# Patient Record
Sex: Female | Born: 1980 | Race: White | Hispanic: No | Marital: Married | Smoking: Former smoker
Health system: Southern US, Community
[De-identification: ages and names within clinical notes are randomized; demographics above are authoritative.]

## PROBLEM LIST (undated history)

## (undated) DIAGNOSIS — E119 Type 2 diabetes mellitus without complications: Secondary | ICD-10-CM

## (undated) DIAGNOSIS — E079 Disorder of thyroid, unspecified: Secondary | ICD-10-CM

## (undated) HISTORY — PX: THYROIDECTOMY: SHX17

## (undated) HISTORY — PX: COMBINED HYSTEROSCOPY DIAGNOSTIC / D&C: SUR297

## (undated) HISTORY — PX: TONSILLECTOMY: SUR1361

---

## 2003-12-13 ENCOUNTER — Other Ambulatory Visit: Admission: RE | Admit: 2003-12-13 | Discharge: 2003-12-13 | Payer: Self-pay | Admitting: Obstetrics & Gynecology

## 2004-08-17 ENCOUNTER — Ambulatory Visit (HOSPITAL_COMMUNITY): Admission: RE | Admit: 2004-08-17 | Discharge: 2004-08-17 | Payer: Self-pay | Admitting: Obstetrics & Gynecology

## 2004-09-17 ENCOUNTER — Encounter (INDEPENDENT_AMBULATORY_CARE_PROVIDER_SITE_OTHER): Payer: Self-pay | Admitting: *Deleted

## 2004-09-17 ENCOUNTER — Ambulatory Visit (HOSPITAL_COMMUNITY): Admission: RE | Admit: 2004-09-17 | Discharge: 2004-09-17 | Payer: Self-pay | Admitting: Obstetrics & Gynecology

## 2005-01-16 ENCOUNTER — Other Ambulatory Visit: Admission: RE | Admit: 2005-01-16 | Discharge: 2005-01-16 | Payer: Self-pay | Admitting: Obstetrics & Gynecology

## 2005-10-04 ENCOUNTER — Ambulatory Visit (HOSPITAL_COMMUNITY): Admission: RE | Admit: 2005-10-04 | Discharge: 2005-10-04 | Payer: Self-pay | Admitting: Urology

## 2005-12-09 ENCOUNTER — Inpatient Hospital Stay (HOSPITAL_COMMUNITY): Admission: AD | Admit: 2005-12-09 | Discharge: 2005-12-09 | Payer: Self-pay | Admitting: Obstetrics and Gynecology

## 2006-01-12 ENCOUNTER — Inpatient Hospital Stay (HOSPITAL_COMMUNITY): Admission: RE | Admit: 2006-01-12 | Discharge: 2006-01-12 | Payer: Self-pay | Admitting: Obstetrics & Gynecology

## 2006-02-15 ENCOUNTER — Inpatient Hospital Stay (HOSPITAL_COMMUNITY): Admission: AD | Admit: 2006-02-15 | Discharge: 2006-02-15 | Payer: Self-pay | Admitting: Obstetrics and Gynecology

## 2007-02-11 ENCOUNTER — Inpatient Hospital Stay (HOSPITAL_COMMUNITY): Admission: RE | Admit: 2007-02-11 | Discharge: 2007-02-14 | Payer: Self-pay | Admitting: Obstetrics & Gynecology

## 2007-02-25 ENCOUNTER — Inpatient Hospital Stay (HOSPITAL_COMMUNITY): Admission: EM | Admit: 2007-02-25 | Discharge: 2007-03-01 | Payer: Self-pay | Admitting: Emergency Medicine

## 2007-02-25 ENCOUNTER — Encounter: Payer: Self-pay | Admitting: Obstetrics & Gynecology

## 2007-02-26 ENCOUNTER — Encounter (INDEPENDENT_AMBULATORY_CARE_PROVIDER_SITE_OTHER): Payer: Self-pay | Admitting: Neurology

## 2007-02-27 ENCOUNTER — Encounter (INDEPENDENT_AMBULATORY_CARE_PROVIDER_SITE_OTHER): Payer: Self-pay | Admitting: Neurology

## 2007-03-27 ENCOUNTER — Ambulatory Visit (HOSPITAL_COMMUNITY): Admission: RE | Admit: 2007-03-27 | Discharge: 2007-03-27 | Payer: Self-pay | Admitting: Neurology

## 2007-04-30 ENCOUNTER — Encounter: Admission: RE | Admit: 2007-04-30 | Discharge: 2007-04-30 | Payer: Self-pay | Admitting: Neurology

## 2009-07-05 ENCOUNTER — Encounter: Admission: RE | Admit: 2009-07-05 | Discharge: 2009-07-05 | Payer: Self-pay | Admitting: Endocrinology

## 2009-07-18 ENCOUNTER — Other Ambulatory Visit: Admission: RE | Admit: 2009-07-18 | Discharge: 2009-07-18 | Payer: Self-pay | Admitting: Interventional Radiology

## 2009-07-18 ENCOUNTER — Encounter: Admission: RE | Admit: 2009-07-18 | Discharge: 2009-07-18 | Payer: Self-pay | Admitting: Endocrinology

## 2009-09-15 ENCOUNTER — Encounter (INDEPENDENT_AMBULATORY_CARE_PROVIDER_SITE_OTHER): Payer: Self-pay | Admitting: General Surgery

## 2009-09-17 ENCOUNTER — Inpatient Hospital Stay (HOSPITAL_COMMUNITY): Admission: RE | Admit: 2009-09-17 | Discharge: 2009-09-19 | Payer: Self-pay | Admitting: General Surgery

## 2009-11-30 IMAGING — RF DG FLUORO GUIDE NDL PLC/BX
2 series · 2 of 2 positions shown · non-contrast
Comparison: none

CLINICAL DATA: Abnormality on MR scan after delivery of child February 2007. 
FLUOROSCOPICALLY GUIDED LUMBAR PUNCTURE:

[Series 1: run · 1 of 1 slices shown (1 of 2)]
[im 1/1]
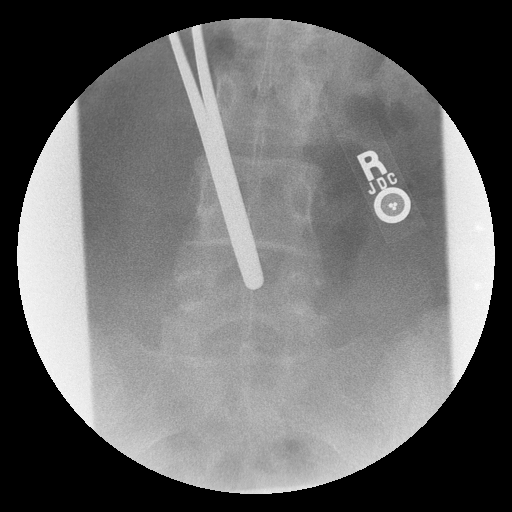

[Series 2: run · 1 of 1 slices shown (2 of 2)]
[im 1/1]
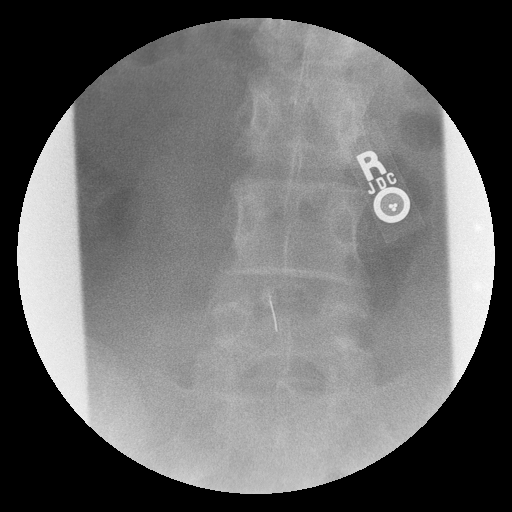

[2 of 2 positions shown; findings below may reference images not displayed]

IMPRESSION: Successful L4-5 lumbar puncture as described above.

## 2009-12-04 ENCOUNTER — Encounter (HOSPITAL_COMMUNITY): Admission: RE | Admit: 2009-12-04 | Discharge: 2009-12-22 | Payer: Self-pay | Admitting: Endocrinology

## 2009-12-27 ENCOUNTER — Encounter: Admission: RE | Admit: 2009-12-27 | Discharge: 2009-12-27 | Payer: Self-pay | Admitting: Endocrinology

## 2010-03-24 ENCOUNTER — Encounter: Payer: Self-pay | Admitting: Endocrinology

## 2010-05-19 LAB — GLUCOSE, CAPILLARY
Glucose-Capillary: 115 mg/dL — ABNORMAL HIGH (ref 70–99)
Glucose-Capillary: 117 mg/dL — ABNORMAL HIGH (ref 70–99)
Glucose-Capillary: 123 mg/dL — ABNORMAL HIGH (ref 70–99)
Glucose-Capillary: 133 mg/dL — ABNORMAL HIGH (ref 70–99)
Glucose-Capillary: 151 mg/dL — ABNORMAL HIGH (ref 70–99)
Glucose-Capillary: 164 mg/dL — ABNORMAL HIGH (ref 70–99)
Glucose-Capillary: 166 mg/dL — ABNORMAL HIGH (ref 70–99)
Glucose-Capillary: 168 mg/dL — ABNORMAL HIGH (ref 70–99)
Glucose-Capillary: 200 mg/dL — ABNORMAL HIGH (ref 70–99)
Glucose-Capillary: 206 mg/dL — ABNORMAL HIGH (ref 70–99)
Glucose-Capillary: 214 mg/dL — ABNORMAL HIGH (ref 70–99)
Glucose-Capillary: 230 mg/dL — ABNORMAL HIGH (ref 70–99)
Glucose-Capillary: 238 mg/dL — ABNORMAL HIGH (ref 70–99)
Glucose-Capillary: 77 mg/dL (ref 70–99)
Glucose-Capillary: 87 mg/dL (ref 70–99)

## 2010-05-19 LAB — BASIC METABOLIC PANEL
BUN: 4 mg/dL — ABNORMAL LOW (ref 6–23)
CO2: 28 mEq/L (ref 19–32)
Calcium: 8.1 mg/dL — ABNORMAL LOW (ref 8.4–10.5)
Creatinine, Ser: 0.57 mg/dL (ref 0.4–1.2)
GFR calc Af Amer: >60 mL/min (ref >60)

## 2010-05-19 LAB — CALCIUM
Calcium: 6.8 mg/dL — ABNORMAL LOW (ref 8.4–10.5)
Calcium: 7.2 mg/dL — ABNORMAL LOW (ref 8.4–10.5)
Calcium: 7.3 mg/dL — ABNORMAL LOW (ref 8.4–10.5)
Calcium: 7.6 mg/dL — ABNORMAL LOW (ref 8.4–10.5)
Calcium: 7.8 mg/dL — ABNORMAL LOW (ref 8.4–10.5)

## 2010-05-19 LAB — PHOSPHORUS: Phosphorus: 5.9 mg/dL — ABNORMAL HIGH (ref 2.3–4.6)

## 2010-05-19 LAB — CALCIUM, IONIZED: Calcium, Ion: 0.97 mmol/L — ABNORMAL LOW (ref 1.12–1.32)

## 2010-05-20 LAB — CBC
Hemoglobin: 13.6 g/dL (ref 12.0–15.0)
Platelets: 353 10*3/uL (ref 150–400)
RBC: 4.67 MIL/uL (ref 3.87–5.11)
WBC: 7.1 10*3/uL (ref 4.0–10.5)

## 2010-05-20 LAB — SURGICAL PCR SCREEN
MRSA, PCR: NEGATIVE
Staphylococcus aureus: NEGATIVE

## 2010-05-20 LAB — BASIC METABOLIC PANEL
Calcium: 9.5 mg/dL (ref 8.4–10.5)
Creatinine, Ser: 0.62 mg/dL (ref 0.4–1.2)
GFR calc Af Amer: >60 mL/min (ref >60)
GFR calc non Af Amer: >60 mL/min (ref >60)
Sodium: 139 mEq/L (ref 135–145)

## 2010-05-20 LAB — DIFFERENTIAL
Lymphs Abs: 1.8 10*3/uL (ref 0.7–4.0)
Monocytes Relative: 6 % (ref 3–12)
Neutro Abs: 4.7 10*3/uL (ref 1.7–7.7)
Neutrophils Relative %: 67 % (ref 43–77)

## 2010-05-20 LAB — PREGNANCY, URINE: Preg Test, Ur: NEGATIVE

## 2010-07-17 NOTE — H&P (Signed)
NAMEDEBERAH, ADOLF              ACCOUNT NO.:  [id number]   MEDICAL RECORD NO.:  [id number]          PATIENT TYPE:  INP   LOCATION:  NA                            FACILITY:  [facility]   PHYSICIAN:  Freddy Finner, M.D.   DATE OF BIRTH:  1981/01/18   DATE OF ADMISSION:  DATE OF DISCHARGE:                              HISTORY & PHYSICAL   ADMISSION DIAGNOSES:  1. Intrauterine pregnancy, 38-2/7 weeks' gestation.  2. Insulin-dependent diabetes mellitus with exquisite control before      and throughout her pregnancy.  3. Macrosomia, 95th percentile in gestational weight.  4. Unfavorable cervix.   The patient is a 30 year old white married female, gravida 1, para 0,  who has been followed through the essentially uneventful pregnancy  although she has been monitored very closely because of her insulin-  dependent diabetes.  She has been treated using an insulin polyp.  A1c's  have all been excellent throughout the pregnancy.  There have been no  other complications.  She has been followed with serial ultrasounds and  nonstress tests, all of which have been normal and had shown adequate  and appropriate fetal growth.  She is admitted now for primary cesarean  delivery.   CURRENT REVIEW OF SYSTEMS:  Negative.   PAST MEDICAL HISTORY:  Recorded in the prenatal summary and will not be  repeated at this time.   She is allergic to PENICILLIN and to [facility].  She has a LATEX allergy.   PHYSICAL EXAM:  HEENT:  Grossly within normal limits.  Thyroid gland is not palpably enlarged.  Most recent blood pressure in the office was 100/72.  CHEST:  Clear to auscultation throughout.  HEART:  Normal sinus rhythm without murmurs, rubs or gallops.  ABDOMEN:  Gravid.  Fundal height 38 cm.  Estimated fetal weight of 8  pounds.  EXTREMITIES:  Without cyanosis, clubbing or edema.  PELVIC:  Exam deferred.  Most recent check of cervix revealed the cervix  to be firm and closed.   ASSESSMENT:  1.  Intrauterine pregnancy at 38-2/7 weeks' gestation.  2. Exquisitely-controlled diabetes mellitus with insulin pump and      excellent patient compliance.   PLAN:  Primary cesarean delivery.     Freddy Finner, M.D.  Electronically Signed    WRN/MEDQ  D:  02/10/2007  T:  02/11/2007  Job:  [id number]

## 2010-07-17 NOTE — Op Note (Signed)
Ashley [name], Ashley [name]              ACCOUNT NO.:  [id number]   MEDICAL RECORD NO.:  [id number]          PATIENT TYPE:  INP   LOCATION:  9134                          FACILITY:  [facility]   PHYSICIAN:  Freddy Finner, M.D.   DATE OF BIRTH:  03/24/1980   DATE OF PROCEDURE:  02/11/2007  DATE OF DISCHARGE:                               OPERATIVE REPORT   PREOPERATIVE DIAGNOSES:  1. Intrauterine pregnancy at 38-2/[redacted] weeks gestation.  2. Well-controlled insulin-dependent diabetes.  3. Unfavorable cervix.   POSTOPERATIVE DIAGNOSES:  1. Intrauterine pregnancy at 38-2/[redacted] weeks gestation.  2. Well-controlled insulin-dependent diabetes.  3. Unfavorable cervix.  4. Delivery of viable female infant, Apgars of 8 at 1 minute and 9 at      5 minutes.  Arterial cord blood for pH was 7.29.   PROCEDURE:  Primary low transverse cervical cesarean section with  delivery of viable female.   SECONDARY PROCEDURE:  Fulguration of two small fundal leiomyomas.   ANESTHESIA:  Spinal.   ESTIMATED INTRAOPERATIVE BLOOD LOSS:  600 mL.   ANTIBIOTICS GIVEN:  The patient was given a gram of Ancef at clamping of  the cord.   INDICATIONS FOR PROCEDURE:  The patient is a 30 year old white married  female primigravida who has been followed through an uneventful  pregnancy and has been very closely monitored by her endocrinologist and  has had serial ultrasounds and nonstress testing and is now admitted at  term for delivery.  She was admitted on the morning of surgery and  brought to the operating room and placed under adequate spinal  anesthesia, placed in the dorsal recumbent position with elevation of  the right hip by 15 degrees.  The abdomen was prepped and draped in the  usual fashion and Foley catheter placed using sterile technique.   A lower abdominal transverse skin incision was made and carried sharply  down to fascia.  Subcutaneous bleeding vessels were controlled with the  Bovie.  The fascia was entered  sharply and extended transversely to the  extent of the skin incision.  The subfascial vessels were controlled  with the Bovie.  The rectus muscles were divide in the midline.  The  peritoneum was elevated and entered sharply and then extended lumbar to  the extent of the skin incision.  The bladder blade was placed.  A  transverse incision was made in the visceral peritoneum overlying the  lower segment of the uterus.  The bladder was bluntly dissected off of  the lower segment.  A transverse incision was made in the lower uterine  segment and the amnion entered with the blunt end of the scalpel handle.  The amniotic fluid was clear.  The incision was extended bluntly in a  transverse direction.  The Kiwi vacuum device was then used to easily  deliver a viable female infant, with statistics as noted above.  Birth  weight was 6 pounds 6 ounces.  There was a double loop of nuchal cord.  The placenta and other products of conception were removed.  The  placenta and cord were submitted for cord blood donation.  The uterus  was delivered onto the anterior abdominal wall.  Careful manual  exploration of the uterine cavity confirmed complete evacuation of  products of conception.  The uterine incision was closed in a double  layer with 0 Monocryl.  The first layer was a running locking suture and  the second an indicating suture of 0 Monocryl.  The bladder flap was  reapproximated with an interrupted 2-0 Monocryl suture in the midline.  Two small myomas were bovied.  They were approximately 5 mm or less in  size and very superficial.  There was no bleeding from the sites.  The  uterus was delivered back into the abdominal cavity.  The tubes and  ovaries were normal.  Appropriate pack, needle, and instrument counts  were correct.  Irrigation was carried out.  The abdominal incision was  then closed in layers.  A running 0 Monocryl was used to close the  peritoneum and reapproximate the rectus  muscles.  A double looped 0 PDS  was used to close the fascia, running from angle to angle on either  side.  The subcutaneous tissue was approximated with running 2-0 plain.  The skin was closed with wide skin staples and quarter-inch Steri-  Strips.   The patient tolerated the operative procedure well and was taken to the  recovery room in good condition.      Freddy Finner, M.D.  Electronically Signed     WRN/MEDQ  D:  02/11/2007  T:  02/12/2007  Job:  [id number]

## 2010-07-17 NOTE — Discharge Summary (Signed)
Ashley [name], Ashley [name]              ACCOUNT NO.:  [id number]   MEDICAL RECORD NO.:  [id number]          PATIENT TYPE:  INP   LOCATION:  3031                         FACILITY:  [facility]   PHYSICIAN:  Melvyn Novas, M.D.  DATE OF BIRTH:  Nov 27, 1980   DATE OF ADMISSION:  02/25/2007  DATE OF DISCHARGE:  03/01/2007                               DISCHARGE SUMMARY   CHIEF COMPLAINT:  At the time, was right sided weakness and by imaging a  stroke was suspected.   HISTORY OF PRESENT ILLNESS:  This 30 year old woman had just delivered  by C-section a healthy infant of February 11, 2007, and then until early  Christmas Eve, she felt at a normal state of health.  She awoke at 8:00  a.m.  When she tried to stand, her right leg would not support her  weight, and she had trouble with ambulation, lay on the floor and was  able to get finally help.  The symptoms continued to improve.   PAST MEDICAL HISTORY:  Was already remarkable for insulin-dependent  diabetes mellitus.  She is on a insulin pump, which needs not to be  adjusted at this time.  The patient's imaging studies were repeated, as instead of a CT, an MRI  was obtained with and without contrast, and this revised our previous  diagnosis from stroke to a likely demyelination.  Dr. 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  Dr. Sharene Skeans followed on December 27, started Solu-  Medrol 250 mg q.6 h. and Pepcid b.i.d.  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  She will have a PT, OT and rehab evaluation in the outpatient setting.  She is eager to see her infant.  I have decided to discharge her today,  since her symptoms have resolved and she has at this time no residual  findings, and she will go home, is asked to continue with prenatal  vitamins, continue with Pepcid and will take prednisone 10 mg for the  next 5  days, then 5 mg for 5 days and then discontinue.  She will need  to adjust the insulin doses.  A 2-D echo and Doppler studies showed no abnormalities as long as stroke  was suspected.  Melvyn Novas, M.D. discharged to the home  environment.  The patient's mother is currently staying with her for another 10 days.   She is asked to follow up with Dr. Thad Ranger in the office in the next 2  to 3 weeks.      Melvyn Novas, M.D.  Electronically Signed     CD/MEDQ  D:  03/01/2007  T:  03/01/2007  Job:  [id number]   cc:   Casimiro Needle L. Thad Ranger, M.D.

## 2010-07-17 NOTE — H&P (Signed)
Ashley [name], Ashley [name]              ACCOUNT NO.:  [id number]   MEDICAL RECORD NO.:  [id number]          PATIENT TYPE:  INP   LOCATION:  3031                         FACILITY:  [facility]   PHYSICIAN:  Casimiro Needle L. Reynolds, M.D.DATE OF BIRTH:  [birth year]   DATE OF ADMISSION:  02/25/2007  DATE OF DISCHARGE:                              HISTORY & PHYSICAL   CHIEF COMPLAINT:  Right-sided weakness and stroke by imaging   HISTORY OF PRESENT ILLNESS:  This is the initial stroke service  admission for this 30 year old woman with a past medical history which  includes diabetes and recent uneventful C-section delivery of a healthy  infant on February 11, 2007.  The patient reports that she was in her  usual state of health until early this morning.  She recalls waking up  at 5 o'clock to feed her infant, and had no problems at that time.  She  awoke again at 8 o'clock to feed the child, and when she tried to stand,  her right leg would not support her weight.  She fell on the right side  and noted her right arm and leg were numb and weak.  She actually lay on  the floor for about 45 minutes until she was able to get up off the  floor and onto the bed to call for help.  By that point, her symptoms  were getting somewhat better, and they gradually continued to improve.  She called her obstetrician and was seen there today for a wound check.  He referred her immediately to the [facility] MRI facility where an MRI  demonstrated a suspected acute infarct, and she was advised to come to  the emergency apartment for further evaluation and ultimately admission.  Right now she says she feels fine.  She thinks she is back to normal,  but her right side feels somewhat sore where she fell.  She says she has  never had anything like that happened to her before.  She reports a  slight headache but denies any associated chest pain, palpitations,  shortness of breath, nausea, or alteration of consciousness.   PAST  MEDICAL HISTORY:  Remarkable for:  1. Insulin-dependent diabetes which was diagnosis in August 2004 for      which she uses insulin pump.  2. She also had a recent C-section for delivery of a child as reported      above.  She states that the pregnancy and the delivery were      uneventful.   She denies thyroid problems.  She history of blood clots in legs or  repeated miscarriages.   FAMILY HISTORY:  She says that her grandmother has MS. She denies a  history of blood disorders.  Both her parents are diabetic, but they  developed this in their 39s, not a particularly young age.   SOCIAL HISTORY:  She is normally independent in her activities of daily  living.  She denies any tobacco or illicit drug use.  She uses alcohol  occasionally.   MEDICATIONS:  NovoLog pump and prenatal vitamins.   ALLERGIES:  PENICILLIN, PROTON PUMP  INHIBITORS, and LATEX.  She also  reports nausea, vomiting with MACROBID.   REVIEW OF SYSTEMS:  NEUROLOGIC: Slight headache as above.  REPRODUCTIVE:  As above.  MUSCULOSKELETAL:  Right-sided soreness.  ENDOCRINE:  As  above.  CARDIOVASCULAR, PULMONARY, GI, GU, HEME, ALLERGY, PSYCH:  All  negative.   PHYSICAL EXAMINATION:  VITAL SIGNS:  Temperature 98.5, blood pressure  123/85, pulse 111, respirations 20, O2 saturation 97% on room air.  GENERAL:  This is a healthy-appearing woman supine in hospital bed in no  distress.  HEENT:  Normocephalic and atraumatic.  Oropharynx benign.  NECK:  Supple without carotid or supraclavicular bruits.  CHEST:  Clear to auscultation bilaterally.  HEART:  Regular rate and rhythm without murmurs.  ABDOMEN:  She has a healing abdominal incision, soft with normal bowel  sounds.  EXTREMITIES:  No edema.  NEUROLOGIC:  Mental Status:  She is awake and alert.  She is fully  oriented to time, place and person.  Recent and remote memory are  intact.  Attention span, concentration, fund of knowledge are all  appropriate.  Speech is  fluent and not dysarthric.  No defects to  confrontational naming, and she can repeat phrase.  Cranial Nerves:  Funduscopic exam is benign.  Pupils equal, briskly reactive.  Extraocular movements full without nystagmus.  Visual fields full to  confrontation.  Hearing is intact to conversational speech.  Facial  sensation intact to pinprick.  Face, tongue and palate move normally and  symmetrically.  Shoulder shrug strength is normal.  Motor: Normal bulk  and tone. Normal strength in all  extremity muscles.  Sensation intact  to light touch, pinprick, proprioception, and double simultaneous  stimulation in all extremities.  Coordination: Rapid movements are  performed accurately. Finger-to-nose and heel-to-shin performed  accurately.  Gait:  She arises easily from chair. Stance is normal.  She  is able to tandem walk without difficulty. Reflexes 2+ and symmetric.  Toes are downgoing bilaterally.   LABORATORY REVIEW:  MRI of the brain performed this afternoon is  personally reviewed, and I went over the study with Dr. Karin Golden.  The  study is remarkable for an acute hyperintensity which is strongly  positive on perfusion imaging which appears to be in the middle of  splenium corporis callosum.  There is no other acute abnormality.  The  remainder of the brain is normal, no evidence of white matter disease.   MR angiography and MR venography of the brain are both normal.   IMPRESSION:  Transient right-sided weakness with abnormalities as above  on MRI.  Most likely possibility is acute stroke, possibly related to  hypercoagulability postpartum or to her history of diabetes.  Differentials would include an usual admission presentation of posterior  reversible leukoencephalopathy syndrome (RPLS), although the findings  are unusual for that entity.  MS is also a possibility, although no  other white matter lesions are seen.   PLAN:  Will admit to [facility] for observation and  brief  workup.  Workup will include extensive labs including hypercoagulable  workup, 2-D echocardiogram, carotid Dopplers, telemetry monitoring.  For  now,  will place her on aspirin and subcutaneous Lovenox.  Stroke  service to follow.      Michael L. Thad Ranger, M.D.  Electronically Signed     MLR/MEDQ  D:  02/25/2007  T:  02/26/2007  Job:  [id number]   cc:   Nestor Ramp, MD

## 2010-07-17 NOTE — Discharge Summary (Signed)
Ashley [name], SEBEK              ACCOUNT NO.:  [id number]   MEDICAL RECORD NO.:  [id number]          PATIENT TYPE:  INP   LOCATION:  9134                          FACILITY:  [facility]   PHYSICIAN:  Guy Sandifer. Henderson Cloud, M.D. DATE OF BIRTH:  11-20-1980   DATE OF ADMISSION:  02/11/2007  DATE OF DISCHARGE:  02/14/2007                               DISCHARGE SUMMARY   ADMISSION DIAGNOSES:  1. Intrauterine pregnancy at 38-1/2 weeks estimated gestational age.  2. Insulin-dependent diabetes.   DISCHARGE DIAGNOSIS:  1. Intrauterine pregnancy at 38-1/2 weeks estimated gestational age.  2. Insulin-dependent diabetes.   PROCEDURE:  Cesarean section on February 11, 2007.   REASON FOR ADMISSION:  This patient is a 30 year old married white  female, G1, P0 who has insulin-dependent diabetes on the pump.  She has  had good control through pregnancy.  She is admitted for delivery.   HOSPITAL COURSE:  The patient admitted to the hospital to undergo the  above procedure.  She has good resumption of bowel activity. She remains  good control of blood sugars via insulin pump.  Hemoglobin was 10.5 on  February 12, 2007.  Pathology is pending.  On the day of discharge she  is ambulating well, passing flatus, tolerating a regular diet with good  pain control.   CONDITION ON DISCHARGE:  Good.   DIET:  Regular as tolerated.   ACTIVITY:  No lifting. No vaginal entry.   She is to call the office with problems including but not limited to a  temperature of 101 degrees, persistent nausea and vomiting, heavy  vaginal bleeding or redness to the incision.   MEDICATIONS:  1. Will continue insulin pump as directed by her diabetes doctor.  2. Percocet 5/325 mg #40 1-2 q.6 hours p.r.n.  3. Ibuprofen 600 mg q.6 hours p.r.n.  4. Prenatal vitamin daily.   FOLLOW UP:  Is in the office in 2 weeks.      Guy Sandifer Henderson Cloud, M.D.  Electronically Signed     JET/MEDQ  D:  02/14/2007  T:  02/15/2007  Job:  [id number]

## 2010-07-20 NOTE — Op Note (Signed)
NAMEHAILLY, Ashley [name]              ACCOUNT NO.:  [id number]   MEDICAL RECORD NO.:  [id number]          PATIENT TYPE:  AMB   LOCATION:  [facility]C                           FACILITY:  [facility]   PHYSICIAN:  Dineen Kid. Rana Snare, M.D.    DATE OF BIRTH:  [birth year]   DATE OF PROCEDURE:  09/17/2004  DATE OF DISCHARGE:                                 OPERATIVE REPORT   PREOPERATIVE DIAGNOSES:  1.  Endometrial cavity defect.  2.  Left tubal obstruction.  3.  Strong family history of endometriosis.   POSTOPERATIVE DIAGNOSES:  1.  Endometrial polyp.  2.  Pelvic adhesions.  3.  Moderate endometriosis.  4.  Right paratubal cyst.   SURGEON:  Dineen Kid. Rana Snare, M.D.   ANESTHESIA:  General endotracheal anesthesia.   OPERATION/PROCEDURE:  1.  Hysteroscopy with dilatation and curettage and polypectomy.  2.  Laparoscopy with CO2 laser ablation of endometriosis.  3.  Lysis of adhesions.  4.  Excision of right paratubal cyst.  5.  Chromopertubation.   INDICATIONS:  Ashley [name] is a 30 year old nulligravida with a filling  defect noted on recent HSG and a right cornua suspicious for an endometrial  polyp.  She also has a retroverted uterus with a question of a left  fallopian tube obstructed. She has a strong family history of endometriosis.  She presents for definitive surgical evaluation and treatment of these  problems.  Planned hysteroscopy, D&C, and laparoscopy with possible lysis of  adhesions or ablation of endometriosis. Risks and benefits have been  discussed at length and informed consent was obtained.   OPERATIVE FINDINGS:  On hysteroscopy, there were two small endometrial  polyps near the right cornua of the uterus.  After removal of the  endometrial polyps, normal appearing right and left ostia were noted, normal-  appearing cervix.  On laparoscopy, intra-abdominal anatomy included a normal-  appearing liver, normal-appearing appendix, normal-appearing uterus. She had  bowel and omentum stuck to  the left pelvic sidewall across the left tubo-  ovarian complex.  She had moderate amount of endometriosis with  endometriosis implants noted along the left pelvic sidewall from the  adhesions to the left tubo-ovarian complex.  Moderate amount of  endometriosis in the left and right uterosacral ligaments in the cul-de-sac  with adhesions but Masters windows in the peritoneum.  The right ovary had  endometriosis implants.  Right ovarian fossa was clear, however.  The  bladder was clear.  There were five of six 10 mm areas of endometriosis near  the appendix but not on the appendix or mesoappendix.  Chromopertubation did  not reveal patency bilaterally.  No obvious evidence of obstruction.  We did  have intravascular injection of the tubal dye without any passage of the dye  through the fallopian tubes.   DESCRIPTION OF PROCEDURE:  After adequate analgesia, the patient was placed  in the dorsal lithotomy position.  She was sterilely prepped and draped.  The bladder was sterilely drained.  Graves speculum was placed.  Tenaculum  was placed on the anterior lip of the cervix.  Uterus was sounded to #7  dilator, in a retroverted position.  Pratt dilator was inserted and cervix  was dilated to a #27 Pratt dilator.  The hysteroscope was inserted.  The  above findings were noted.  Polyp forceps were used to gently grasp the  polyps near the right cornua.  After removal of these fragments of tissue,  gentle sharp curettage was performed in that area, retrieving the base of  the endometrial polyps.  Reexamination with hysteroscope at this time  revealed a normal-appearing endometrial cavity with normal-appearing ostia.  There was difficulty with the camera and the printer today so the  photographs were not ideal and several photographs did not get printed  properly.   On laparoscopy, once the infraumbilical skin incision was made, a Veress  needle was inserted.  The abdomen was insufflated with  dullness to  percussion.  The 11 mm trocar was inserted. Laparoscopic was inserted. The  above findings were noted.  A 5 mm trocar was inserted to the left of the  midline two fingerbreadths above the pubic symphysis under direct  visualization.  Bipolar cautery and __________ scissors were used to  coagulate and dissect along the adhesions of the omentum and the bowel and  left pelvic sidewall.  After the adhesions were removed, we were then able  to adequately examine the pelvis and the cul-de-sac because of the moderate  amount of endometriosis.  The CO2 laser was then attached.  All areas of  endometriosis were carefully ablated with the CO2 laser.  With irrigation  applied, adequate thermal burn was noted through these lesions along the  left pelvic sidewall, left ovarian fossa down to the left uterosacral  ligament, right uterosacral ligament, cul-de-sac, right ovary, and the area  just above the appendix.  A right paratubal cyst in the mid portion of the  tube was easily grasped and coagulated with bipolar scissors and sharply  excised to remove.  This was done well away from the tubal lumen.  Attempts  at chromopertubation at this time were unsuccessful with the dye going into  the vasculature of the uterus.  No evidence of obstruction was noted on  either fallopian tube nor was there any dye coming from the fimbriated end  of the tube.  The __________ tenaculum was removed and repositioned and  replaced and second attempt to chromopertubation was then successful again  with dye appearing to be going intravascular within the uterus and no  obvious spill but no obvious obstruction either.  At this time the  laparoscopic was removed.  The trocars were removed. Abdomen was  desufflated.  The infraumbilical skin incision was closed with 0 Vicryl  interrupted suture, 3-0 Vicryl Rapide subcuticular suture and the 5 mm site was closed with 3-0 Vicryl Rapide subcuticular suture and an  interrupted  suture externally.  The tenaculum was removed from the cervix  which was  noted to be hemostatic.  Sponge, needle and instrument counts were correct  x3. Estimated blood loss minimal.  Sorbitol deficit was also minimal.  The  patient received 1 g of Rocephin preoperatively and 30 mg postoperatively.   DISPOSITION:  The patient was discharged home with followup in the office in  two to three weeks.  Given a routine instruction sheet for D&C and  laparoscopy.  Told to return for increased pain, fever or bleeding.       DCL/MEDQ  D:  09/17/2004  T:  09/17/2004  Job:  [id number]

## 2010-07-20 NOTE — H&P (Signed)
[name], [name]              ACCOUNT NO.:  [id number]   MEDICAL RECORD NO.:  [id number]          PATIENT TYPE:  AMB   LOCATION:  [facility]C                           FACILITY:  [facility]   PHYSICIAN:  Dineen Kid. Rana Snare, M.D.    DATE OF BIRTH:  [birth year]   DATE OF ADMISSION:  09/17/2004  DATE OF DISCHARGE:                                HISTORY & PHYSICAL   HISTORY OF PRESENT ILLNESS:  Ms. Ashley [name] is a 30 year old, nulligravida with  a filling defect noted on a recent HSG in the right cornu which is  suspicious for an endometrial polyp.  She also has a retroflexed uterus with  questionable defect of the left fallopian tube noted also on the HSG.  She  does have some ovulation dysfunction and some questionable dysmenorrhea or  pelvic discomfort and presents for definitive evaluation of endometrial  cavity defect and also possible adhesions versus endometriosis in the pelvis  based on HSG findings.   PAST MEDICAL HISTORY:  1.  Mitral valve prolapse.  2.  Adult onset diabetes.  The last hemoglobin A1c of 7.2 followed by an      endocrinologist.   MEDICATIONS:  Multivitamin and insulin.   ALLERGIES:  1.  PENICILLIN.  2.  MACROBID.  3.  LATEX.  4.  ALL PROTON PUMP INHIBITORS.   PHYSICAL EXAMINATION PER DR. NEAL:  VITAL SIGNS:  Blood pressure is 112/80.  ABDOMEN:  Soft, nontender, nondistended.  Uterus is retroverted, mobile, no  adnexal masses.  This is all per Dr. Jennette Kettle.   IMPRESSION AND PLAN:  1.  Endometrial cavity defect, possible polyp, and left tubal defect      consistent or suspicious for endometriosis or adhesions.  Plan      hysteroscopy D&C for evaluation of the endometrium and also laparoscopic      evaluation and possibly lysis of adhesions or ablation of endometriosis.      The risks and benefits were discussed at length by Dr. Jennette Kettle with the      patient which are included but not limited to risk of infection,      bleeding, damage to uterus, tubes, ovaries, bowel, bladder,  possibility      that this may not alleviate the scar tissue or that this may recur.      This may or may not help her fertility issues.  They can certainly      recur.  She apparently gives her informed consent and would like to      proceed.       DCL/MEDQ  D:  09/14/2004  T:  09/14/2004  Job:  [id number]

## 2010-11-22 LAB — OLIGOCLONAL BANDS, CSF + SERM
Albumin, Serum(Neph): 4070 mg/dL (ref 3500–5200)
IgG Index, CSF: 0.5 ratio (ref 0.28–0.66)
IgG, CSF: 2.3 mg/dL (ref 0.0–6.0)
IgG/Albumin Ratio, CSF: 0.09 ratio (ref 0.09–0.25)
MS CNS IgG Synthesis Rate: 0 mg/d (ref 0.0–8.0)

## 2010-11-22 LAB — PROTEIN AND GLUCOSE, CSF
Glucose, CSF: 46
Total  Protein, CSF: 38

## 2010-11-22 LAB — CSF CELL COUNT WITH DIFFERENTIAL: WBC, CSF: 0

## 2010-11-22 LAB — GRAM STAIN

## 2010-12-07 LAB — CBC
HCT: 35 — ABNORMAL LOW
HCT: 39.6
Hemoglobin: 11.6 — ABNORMAL LOW
MCV: 82.2
Platelets: 430 — ABNORMAL HIGH
Platelets: 521 — ABNORMAL HIGH
RDW: 15.4
RDW: 15.4
WBC: 5.9

## 2010-12-07 LAB — COMPREHENSIVE METABOLIC PANEL
AST: 27
Albumin: 3.8
Alkaline Phosphatase: 130 — ABNORMAL HIGH
BUN: 5 — ABNORMAL LOW
Creatinine, Ser: 0.57
GFR calc Af Amer: >60
Potassium: 3.6
Total Protein: 7

## 2010-12-07 LAB — URINALYSIS, ROUTINE W REFLEX MICROSCOPIC
Glucose, UA: NEGATIVE
Ketones, ur: 15 — AB
Nitrite: NEGATIVE
Protein, ur: NEGATIVE
Urobilinogen, UA: 0.2

## 2010-12-07 LAB — URINE MICROSCOPIC-ADD ON

## 2010-12-07 LAB — LIPID PANEL
LDL Cholesterol: 149 — ABNORMAL HIGH
Triglycerides: 79
VLDL: 16

## 2010-12-07 LAB — PROTEIN S ACTIVITY: Protein S Activity: 68 % — ABNORMAL LOW (ref 69–129)

## 2010-12-07 LAB — HEPARIN LEVEL (UNFRACTIONATED)
Heparin Unfractionated: 0.18 — ABNORMAL LOW
Heparin Unfractionated: 0.34

## 2010-12-07 LAB — PROTEIN S, TOTAL: Protein S Ag, Total: 98 % (ref 70–140)

## 2010-12-07 LAB — PROTIME-INR: INR: 1

## 2010-12-07 LAB — BETA-2-GLYCOPROTEIN I ABS, IGG/M/A: Beta-2-Glycoprotein I IgA: 4 U/mL (ref <10)

## 2010-12-07 LAB — APTT: aPTT: 32

## 2010-12-07 LAB — FACTOR 5 LEIDEN

## 2010-12-07 LAB — LUPUS ANTICOAGULANT PANEL: PTT Lupus Anticoagulant: 47.6 (ref 36.3–48.8)

## 2010-12-07 LAB — PROTHROMBIN GENE MUTATION

## 2010-12-07 LAB — ANTI-NUCLEAR AB-TITER (ANA TITER): ANA Titer 1: 1:80 {titer} — ABNORMAL HIGH

## 2010-12-07 LAB — SEDIMENTATION RATE: Sed Rate: 15

## 2010-12-07 LAB — ANA: Anti Nuclear Antibody(ANA): POSITIVE — AB

## 2010-12-07 LAB — HEMOGLOBIN A1C
Hgb A1c MFr Bld: 5.6
Mean Plasma Glucose: 122

## 2010-12-07 LAB — PROTEIN C ACTIVITY: Protein C Activity: 162 % — ABNORMAL HIGH (ref 75–133)

## 2010-12-10 LAB — CBC
HCT: 34.2 — ABNORMAL LOW
Hemoglobin: 11.4 — ABNORMAL LOW
MCHC: 33.4
MCHC: 33.6
MCV: 81.8
MCV: 82
Platelets: 309
RBC: 4.19
RDW: 14.5

## 2010-12-10 LAB — BASIC METABOLIC PANEL
BUN: 4 — ABNORMAL LOW
CO2: 21
Chloride: 105
Creatinine, Ser: 0.5
Glucose, Bld: 200 — ABNORMAL HIGH

## 2010-12-10 LAB — RPR: RPR Ser Ql: NONREACTIVE

## 2010-12-21 ENCOUNTER — Other Ambulatory Visit (HOSPITAL_COMMUNITY): Payer: Self-pay | Admitting: Endocrinology

## 2010-12-21 DIAGNOSIS — Z85850 Personal history of malignant neoplasm of thyroid: Secondary | ICD-10-CM

## 2011-01-07 ENCOUNTER — Encounter (HOSPITAL_COMMUNITY)
Admission: RE | Admit: 2011-01-07 | Discharge: 2011-01-07 | Disposition: A | Discharge status: Home / Self Care | Payer: 59 | Source: Ambulatory Visit | Attending: Endocrinology | Admitting: Endocrinology

## 2011-01-07 DIAGNOSIS — Z85850 Personal history of malignant neoplasm of thyroid: Secondary | ICD-10-CM

## 2011-01-07 MED ORDER — THYROTROPIN ALFA 1.1 MG IM SOLR
0.9000 mg | INTRAMUSCULAR | Status: AC
  Administered 2011-01-07: 0.9 mg via INTRAMUSCULAR
  Filled 2011-01-07: qty 0.9

## 2011-01-08 ENCOUNTER — Encounter (HOSPITAL_COMMUNITY)
Admission: RE | Admit: 2011-01-08 | Discharge: 2011-01-08 | Disposition: A | Discharge status: Home / Self Care | Payer: Self-pay | Source: Ambulatory Visit | Attending: Endocrinology | Admitting: Endocrinology

## 2011-01-08 DIAGNOSIS — C73 Malignant neoplasm of thyroid gland: Secondary | ICD-10-CM | POA: Insufficient documentation

## 2011-01-08 MED ORDER — THYROTROPIN ALFA 1.1 MG IM SOLR
0.9000 mg | INTRAMUSCULAR | Status: AC
  Administered 2011-01-08: 0.9 mg via INTRAMUSCULAR
  Filled 2011-01-08: qty 0.9

## 2011-01-08 MED FILL — Thyrotropin Alfa For Inj 1.1 MG: INTRAMUSCULAR | Qty: 0.9 | Status: AC

## 2011-01-09 ENCOUNTER — Ambulatory Visit (HOSPITAL_COMMUNITY): Payer: Self-pay

## 2011-01-09 LAB — HCG, SERUM, QUALITATIVE: Preg, Serum: NEGATIVE

## 2011-01-11 ENCOUNTER — Ambulatory Visit (HOSPITAL_COMMUNITY)
Admission: RE | Admit: 2011-01-11 | Discharge: 2011-01-11 | Disposition: A | Discharge status: Home / Self Care | Payer: 59 | Source: Ambulatory Visit | Attending: Endocrinology | Admitting: Endocrinology

## 2011-01-11 DIAGNOSIS — C73 Malignant neoplasm of thyroid gland: Secondary | ICD-10-CM | POA: Insufficient documentation

## 2011-01-11 MED ORDER — SODIUM IODIDE I 131 CAPSULE
4.0000 | Freq: Once | INTRAVENOUS | Status: AC | PRN
Start: 2011-01-11 — End: 2011-01-11
  Administered 2011-01-11: 4 via ORAL

## 2011-04-24 ENCOUNTER — Ambulatory Visit (INDEPENDENT_AMBULATORY_CARE_PROVIDER_SITE_OTHER): Payer: 59 | Admitting: Family Medicine

## 2011-04-24 VITALS — BP 106/68 | HR 88 | Temp 98.3°F | Resp 16 | Ht 67.0 in | Wt 208.0 lb

## 2011-04-24 DIAGNOSIS — J069 Acute upper respiratory infection, unspecified: Secondary | ICD-10-CM

## 2011-04-24 MED ORDER — AZITHROMYCIN 250 MG PO TABS: ORAL_TABLET | ORAL | Status: AC

## 2011-04-24 NOTE — Progress Notes (Signed)
  Subjective:    Patient ID: Ashley [name], female    DOB: 1980-11-22, 31 y.o.   MRN: [id number]  HPI  Ashley [name] is a 31 y.o. female 1st started yesterday am - scratchy throat, cough.  Worse sinus pressure and earache/popping overnight.  No fever.  Sick contact - daughter with runny nose.  Sinus infections every spring and fall.   Tx: Vicks on chest.  Had flu shot earlier this year.   Review of Systems  Constitutional: Positive for fever, chills and fatigue.  HENT: Positive for congestion, sore throat, rhinorrhea, sneezing and sinus pressure. Negative for nosebleeds and facial swelling.        Green nasal discharge  Eyes: Positive for itching. Negative for discharge and redness.  Respiratory: Positive for cough. Negative for shortness of breath and wheezing.        Min cough at night when lying down.  Cardiovascular: Negative for chest pain.  Musculoskeletal: Negative for myalgias.  Skin: Negative for rash.       Objective:   Physical Exam  Constitutional: She is oriented to person, place, and time. She appears well-developed and well-nourished. No distress.  HENT:  Head: Normocephalic and atraumatic.  Right Ear: Hearing, tympanic membrane, external ear and ear canal normal.  Left Ear: Hearing, tympanic membrane, external ear and ear canal normal.  Nose: Nose normal. Right sinus exhibits no maxillary sinus tenderness and no frontal sinus tenderness. Left sinus exhibits no maxillary sinus tenderness and no frontal sinus tenderness.  Mouth/Throat: Oropharynx is clear and moist. No oropharyngeal exudate.  Eyes: Conjunctivae and EOM are normal. Pupils are equal, round, and reactive to light.  Cardiovascular: Normal rate, regular rhythm, normal heart sounds and intact distal pulses.   No murmur heard. Pulmonary/Chest: Effort normal and breath sounds normal. No respiratory distress. She has no wheezes. She has no rhonchi.  Neurological: She is alert and oriented to  person, place, and time.  Skin: Skin is warm and dry. No rash noted.  Psychiatric: She has a normal mood and affect. Her behavior is normal.        Assessment & Plan:  Ashley [name] is a 31 y.o. female 1. URI (upper respiratory infection)     Symptomatic care reviewed. Push fluids, rest as able, saline nasal spray or Netipot. Hx of sinusitis, but unlikely at present.  Rx zpak on file if not improving in 5-7 days. Return to the clinic or go to the nearest emergency room if symptoms worsen or new symptoms occur.

## 2012-02-21 ENCOUNTER — Other Ambulatory Visit (HOSPITAL_COMMUNITY): Payer: Self-pay | Admitting: Cardiovascular Disease

## 2012-02-21 DIAGNOSIS — R002 Palpitations: Secondary | ICD-10-CM

## 2012-02-27 ENCOUNTER — Ambulatory Visit (HOSPITAL_COMMUNITY)
Admission: RE | Admit: 2012-02-27 | Discharge: 2012-02-27 | Disposition: A | Discharge status: Home / Self Care | Payer: 59 | Source: Ambulatory Visit | Attending: Cardiovascular Disease | Admitting: Cardiovascular Disease

## 2012-02-27 DIAGNOSIS — R011 Cardiac murmur, unspecified: Secondary | ICD-10-CM | POA: Insufficient documentation

## 2012-02-27 DIAGNOSIS — R002 Palpitations: Secondary | ICD-10-CM

## 2012-02-27 NOTE — Progress Notes (Signed)
2D Echo Performed 02/27/2012    Dynver Clemson, RCS  

## 2012-09-08 ENCOUNTER — Ambulatory Visit: Payer: 59 | Admitting: Cardiovascular Disease

## 2012-10-12 ENCOUNTER — Ambulatory Visit: Payer: 59 | Admitting: Cardiovascular Disease

## 2013-04-12 ENCOUNTER — Ambulatory Visit (INDEPENDENT_AMBULATORY_CARE_PROVIDER_SITE_OTHER): Payer: 59 | Admitting: Physician Assistant

## 2013-04-12 ENCOUNTER — Encounter: Payer: Self-pay | Admitting: Physician Assistant

## 2013-04-12 VITALS — BP 100/60 | HR 78 | Ht 67.0 in | Wt 220.7 lb

## 2013-04-12 DIAGNOSIS — E669 Obesity, unspecified: Secondary | ICD-10-CM

## 2013-04-12 DIAGNOSIS — E109 Type 1 diabetes mellitus without complications: Secondary | ICD-10-CM | POA: Insufficient documentation

## 2013-04-12 DIAGNOSIS — R002 Palpitations: Secondary | ICD-10-CM | POA: Insufficient documentation

## 2013-04-12 NOTE — Progress Notes (Signed)
Date:  04/12/2013   ID:  Ashley [name], DOB 05-15-1980, MRN [id number]  PCP:  Gerrit Heck, MD  Primary Cardiologist:  Claiborne Billings    History of Present Illness: Ashley [name] is a 33 y.o. 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  She last saw Dr. Claiborne Billings in January 2014 and has been on diastolic 5 mg for palpitations since December 2013. She had an echocardiogram which showed normal ejection fraction and no other abnormalities.  The patient was initially seen by her primary care provider due to severe fatigue. It was suggested that perhaps stopping the diastolic would help.  She has very infrequent palpitations which occur mostly in the evening when they do occur after lying down.  ROS: The patient currently denies nausea, vomiting, fever, chest pain, shortness of breath, orthopnea, dizziness, PND, cough, congestion, abdominal pain, hematochezia, melena, lower extremity edema, claudication.  Wt Readings from Last 3 Encounters:  04/12/13 220 lb 11.2 oz (100.109 kg)  04/24/11 208 lb (94.348 kg)     No past medical history on file.  Current Outpatient Prescriptions  Medication Sig Dispense Refill  . calcitRIOL (ROCALTROL) 0.5 MCG capsule Take 0.5 mcg by mouth 2 (two) times daily.      . Calcium Carbonate-Vitamin D (CALCIUM-D) 600-400 MG-UNIT TABS Take 1 tablet by mouth 3 (three) times daily.      Marland Kitchen escitalopram (LEXAPRO) 10 MG tablet Take 10 mg by mouth daily.      . insulin lispro (HUMALOG) 100 UNIT/ML injection Pt is on a pump      . levothyroxine (SYNTHROID, LEVOTHROID) 150 MCG tablet Take 150 mcg by mouth daily before breakfast. Sat and Sun      . levothyroxine (SYNTHROID, LEVOTHROID) 175 MCG tablet Take 175 mcg by mouth daily. Mon through Fri      . nebivolol (BYSTOLIC) 5 MG tablet Take 5 mg by mouth daily.      .  Norethindrone-Ethinyl Estradiol-Fe Biphas (LO LOESTRIN FE) 1 MG-10 MCG / 10 MCG tablet Take 1 tablet by mouth daily.      . vitamin B-12 (CYANOCOBALAMIN) 1000 MCG tablet Take 1,000 mcg by mouth daily.       No current facility-administered medications for this visit.    Allergies:    Allergies  Allergen Reactions  . Esomeprazole Magnesium Hives    Avoid all PPI's  . Nitrofurantoin Monohyd Macro Nausea And Vomiting  . Penicillins Other (See Comments)    child  . Latex Rash    Avoid adhesives also    Social History:  The patient  reports that she quit smoking about 15 years ago. Her smoking use included Cigarettes. She smoked 0.00 packs per day. She does not have any smokeless tobacco history on file.   Family history:  No family history on file.  ROS:  Please see the history of present illness.  All other systems reviewed and negative.   PHYSICAL EXAM: VS:  BP 100/60  Pulse 78  Ht 5\' 7"  (1.702 m)  Wt 220 lb 11.2 oz (100.109 kg)  BMI 34.56 kg/m2 Obese, well developed, in no acute distress HEENT: Pupils are equal round react to light accommodation extraocular movements are intact.  Cardiac: Regular rate and rhythm without murmurs rubs or gallops. Lungs:  clear to auscultation bilaterally, no wheezing, rhonchi or rales Ext: no lower extremity edema.  2+ radial and  dorsalis pedis pulses. Skin: warm and dry Neuro:  Grossly normal  EKG:  Normal sinus rhythm rate 70 beats per minute   ASSESSMENT AND PLAN:  Problem List Items Addressed This Visit   Palpitations - Primary     We'll try decreasing the patient's lifestyle at 2.5 mg for the next week and then stopping it altogether after that. I did suggest that she has an increase in her palpitations later on she take the diastolic as needed basis. He'll follow up in 6 months however she's doing well, she pushed for to a year    Obesity (BMI 30-39.9)   Diabetes mellitus type 1

## 2013-04-12 NOTE — Patient Instructions (Signed)
1.  Decrease bystolic to 2.5mg  for a week and then stop altogether. 2.  Follow up with Dr. Claiborne Billings in 6 months.

## 2013-04-12 NOTE — Assessment & Plan Note (Signed)
We'll try decreasing the patient's lifestyle at 2.5 mg for the next week and then stopping it altogether after that. I did suggest that she has an increase in her palpitations later on she take the diastolic as needed basis. He'll follow up in 6 months however she's doing well, she pushed for to a year

## 2014-08-25 ENCOUNTER — Other Ambulatory Visit: Payer: Self-pay | Admitting: Obstetrics & Gynecology

## 2014-08-26 LAB — CYTOLOGY - PAP

## 2014-09-30 ENCOUNTER — Encounter: Payer: Self-pay | Admitting: Cardiovascular Disease

## 2014-10-03 ENCOUNTER — Encounter: Payer: Self-pay | Admitting: Cardiovascular Disease

## 2015-10-26 ENCOUNTER — Emergency Department (HOSPITAL_BASED_OUTPATIENT_CLINIC_OR_DEPARTMENT_OTHER)
Admission: EM | Admit: 2015-10-26 | Discharge: 2015-10-26 | Disposition: A | Discharge status: Home / Self Care | Payer: 59 | Attending: Emergency Medicine | Admitting: Emergency Medicine

## 2015-10-26 ENCOUNTER — Encounter (HOSPITAL_BASED_OUTPATIENT_CLINIC_OR_DEPARTMENT_OTHER): Payer: Self-pay | Admitting: *Deleted

## 2015-10-26 DIAGNOSIS — W57XXXA Bitten or stung by nonvenomous insect and other nonvenomous arthropods, initial encounter: Secondary | ICD-10-CM | POA: Diagnosis not present

## 2015-10-26 DIAGNOSIS — Y999 Unspecified external cause status: Secondary | ICD-10-CM | POA: Insufficient documentation

## 2015-10-26 DIAGNOSIS — Y929 Unspecified place or not applicable: Secondary | ICD-10-CM | POA: Insufficient documentation

## 2015-10-26 DIAGNOSIS — S1096XA Insect bite of unspecified part of neck, initial encounter: Secondary | ICD-10-CM | POA: Diagnosis not present

## 2015-10-26 DIAGNOSIS — Z87891 Personal history of nicotine dependence: Secondary | ICD-10-CM | POA: Insufficient documentation

## 2015-10-26 DIAGNOSIS — E119 Type 2 diabetes mellitus without complications: Secondary | ICD-10-CM | POA: Diagnosis not present

## 2015-10-26 DIAGNOSIS — Y939 Activity, unspecified: Secondary | ICD-10-CM | POA: Insufficient documentation

## 2015-10-26 DIAGNOSIS — R21 Rash and other nonspecific skin eruption: Secondary | ICD-10-CM | POA: Diagnosis present

## 2015-10-26 DIAGNOSIS — Z794 Long term (current) use of insulin: Secondary | ICD-10-CM | POA: Insufficient documentation

## 2015-10-26 DIAGNOSIS — Z79899 Other long term (current) drug therapy: Secondary | ICD-10-CM | POA: Insufficient documentation

## 2015-10-26 HISTORY — DX: Disorder of thyroid, unspecified: E07.9

## 2015-10-26 HISTORY — DX: Type 2 diabetes mellitus without complications: E11.9

## 2015-10-26 NOTE — ED Triage Notes (Signed)
She has a small circular lesion on the back of her neck.

## 2015-10-26 NOTE — Discharge Instructions (Signed)
Continue to monitor your lesion for the next few days.  Follow up with your primary care provider in the next week if your rash has not improved.  Please return to the Emergency Department if symptoms worsen or new onset of fever, rash.
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
## 2016-02-07 ENCOUNTER — Encounter: Payer: 59 | Attending: Endocrinology | Admitting: *Deleted

## 2016-02-07 DIAGNOSIS — Z713 Dietary counseling and surveillance: Secondary | ICD-10-CM | POA: Insufficient documentation

## 2016-02-07 DIAGNOSIS — E108 Type 1 diabetes mellitus with unspecified complications: Secondary | ICD-10-CM | POA: Insufficient documentation

## 2016-02-07 DIAGNOSIS — E109 Type 1 diabetes mellitus without complications: Secondary | ICD-10-CM

## 2016-02-07 NOTE — Progress Notes (Signed)
Diabetes Self-Management Education  Visit Type: First/Initial  Appt. Start Time: 0830 Appt. End Time: 0930  02/07/2016  Ms. Ashley [name], identified by name and date of birth, is a 35 y.o. female with a diagnosis of Diabetes: Type 1. She was diagnosed in 2004 with DM 2, but quickly moved to insulin and diagnosis changed to DM 1. She has been on [employer] insulin pump for many years but struggles to bring A1c into the 7's. She would like refresher on Carb Counting to assist in better BG control.  ASSESSMENT  There were no vitals taken for this visit. Patient declined There is no height or weight on file to calculate BMI.      Diabetes Self-Management Education - 02/07/16 0843      Visit Information   Visit Type First/Initial     Initial Visit   Diabetes Type Type 1   Are you currently following a meal plan? No   Are you taking your medications as prescribed? Yes   Date Diagnosed 2004     Health Coping   How would you rate your overall health? Fair     Psychosocial Assessment   Patient Belief/Attitude about Diabetes Defeat/Burnout   Self-care barriers None   Self-management support Friends;Doctor's office   Other persons present Patient   Patient Concerns Nutrition/Meal planning;Glycemic Control   Special Needs None   How often do you need to have someone help you when you read instructions, pamphlets, or other written materials from your doctor or pharmacy? 1 - Never   What is the last grade level you completed in school? college     Complications   Last HgB A1C per patient/outside source 8.2 %   How often do you check your blood sugar? 3-4 times/day   Number of hypoglycemic episodes per month 3   Can you tell when your blood sugar is low? Yes   What do you do if your blood sugar is low? juice OR Glucose tablets   Have you had a dilated eye exam in the past 12 months? Yes   Have you had a dental exam in the past 12 months? Yes   Are you checking your feet? Yes   How  many days per week are you checking your feet? 7     Dietary Intake   Breakfast fried egg on english muffin, OR biscuit on way to work   Snack (morning) just if low BG   Lunch late lunch if working, brings from home: sandwich with chips, raw veggies OR cereal with 1% milk organic   Snack (afternoon) PNB crackers, PNB with raw veggies   Dinner meat, starch, veggies, dinner roll   Snack (evening) popcorn OR fudge pop   Beverage(s) diet coke, small amounts of water     Exercise   Exercise Type ADL's   How many days per week to you exercise? 0   How many minutes per day do you exercise? 0   Total minutes per week of exercise 0     Patient Education   Previous Diabetes Education Yes (please comment)  2004   Nutrition management  Role of diet in the treatment of diabetes and the relationship between the three main macronutrients and blood glucose level;Food label reading, portion sizes and measuring food.;Carbohydrate counting;Information on hints to eating out and maintain blood glucose control.     Individualized Goals (developed by patient)   Nutrition General guidelines for healthy choices and portions discussed;Other (comment)  Carb counting by food  group for insulin pump   Health Coping Other (comment)  provided information on DM 1/pump support group     Outcomes   Expected Outcomes Demonstrated interest in learning. Expect positive outcomes   Future DMSE PRN   Program Status Completed      Individualized Plan for Diabetes Self-Management Training:   Learning Objective:  Patient will have a greater understanding of diabetes self-management. Patient education plan is to attend individual and/or group sessions per assessed needs and concerns.   Plan:   There are no Patient Instructions on file for this visit.  Expected Outcomes:  Demonstrated interest in learning. Expect positive outcomes  Education material provided: Meal plan card and Carbohydrate counting sheet  If  problems or questions, patient to contact team via:  Phone and Email  Future DSME appointment: PRN

## 2016-03-19 DIAGNOSIS — L718 Other rosacea: Secondary | ICD-10-CM | POA: Diagnosis not present

## 2016-03-29 DIAGNOSIS — E103292 Type 1 diabetes mellitus with mild nonproliferative diabetic retinopathy without macular edema, left eye: Secondary | ICD-10-CM | POA: Diagnosis not present

## 2016-04-05 DIAGNOSIS — M545 Low back pain: Secondary | ICD-10-CM | POA: Diagnosis not present

## 2016-04-16 DIAGNOSIS — E109 Type 1 diabetes mellitus without complications: Secondary | ICD-10-CM | POA: Diagnosis not present

## 2016-07-04 DIAGNOSIS — E1065 Type 1 diabetes mellitus with hyperglycemia: Secondary | ICD-10-CM | POA: Diagnosis not present

## 2016-07-04 DIAGNOSIS — C73 Malignant neoplasm of thyroid gland: Secondary | ICD-10-CM | POA: Diagnosis not present

## 2016-07-04 DIAGNOSIS — E892 Postprocedural hypoparathyroidism: Secondary | ICD-10-CM | POA: Diagnosis not present

## 2016-07-04 DIAGNOSIS — E890 Postprocedural hypothyroidism: Secondary | ICD-10-CM | POA: Diagnosis not present

## 2016-07-26 DIAGNOSIS — E109 Type 1 diabetes mellitus without complications: Secondary | ICD-10-CM | POA: Diagnosis not present

## 2016-08-26 DIAGNOSIS — E890 Postprocedural hypothyroidism: Secondary | ICD-10-CM | POA: Diagnosis not present

## 2016-08-28 DIAGNOSIS — Z01419 Encounter for gynecological examination (general) (routine) without abnormal findings: Secondary | ICD-10-CM | POA: Diagnosis not present

## 2016-11-06 DIAGNOSIS — E109 Type 1 diabetes mellitus without complications: Secondary | ICD-10-CM | POA: Diagnosis not present

## 2016-12-20 DIAGNOSIS — Z23 Encounter for immunization: Secondary | ICD-10-CM | POA: Diagnosis not present

## 2017-02-11 DIAGNOSIS — E109 Type 1 diabetes mellitus without complications: Secondary | ICD-10-CM | POA: Diagnosis not present

## 2017-02-20 DIAGNOSIS — E109 Type 1 diabetes mellitus without complications: Secondary | ICD-10-CM | POA: Diagnosis not present

## 2017-02-23 DIAGNOSIS — E109 Type 1 diabetes mellitus without complications: Secondary | ICD-10-CM | POA: Diagnosis not present

## 2017-03-29 DIAGNOSIS — M25561 Pain in right knee: Secondary | ICD-10-CM | POA: Diagnosis not present

## 2017-04-16 DIAGNOSIS — J101 Influenza due to other identified influenza virus with other respiratory manifestations: Secondary | ICD-10-CM | POA: Diagnosis not present

## 2017-06-13 DIAGNOSIS — E109 Type 1 diabetes mellitus without complications: Secondary | ICD-10-CM | POA: Diagnosis not present

## 2017-06-24 DIAGNOSIS — E109 Type 1 diabetes mellitus without complications: Secondary | ICD-10-CM | POA: Diagnosis not present

## 2017-07-30 DIAGNOSIS — E103293 Type 1 diabetes mellitus with mild nonproliferative diabetic retinopathy without macular edema, bilateral: Secondary | ICD-10-CM | POA: Diagnosis not present

## 2017-08-11 DIAGNOSIS — E1065 Type 1 diabetes mellitus with hyperglycemia: Secondary | ICD-10-CM | POA: Diagnosis not present

## 2017-08-11 DIAGNOSIS — E890 Postprocedural hypothyroidism: Secondary | ICD-10-CM | POA: Diagnosis not present

## 2017-08-11 DIAGNOSIS — R5383 Other fatigue: Secondary | ICD-10-CM | POA: Diagnosis not present

## 2017-08-11 DIAGNOSIS — C73 Malignant neoplasm of thyroid gland: Secondary | ICD-10-CM | POA: Diagnosis not present

## 2017-08-19 DIAGNOSIS — R202 Paresthesia of skin: Secondary | ICD-10-CM | POA: Diagnosis not present

## 2017-08-20 ENCOUNTER — Other Ambulatory Visit: Payer: Self-pay

## 2017-08-20 ENCOUNTER — Encounter (HOSPITAL_BASED_OUTPATIENT_CLINIC_OR_DEPARTMENT_OTHER): Payer: Self-pay | Admitting: Emergency Medicine

## 2017-08-20 ENCOUNTER — Emergency Department (HOSPITAL_BASED_OUTPATIENT_CLINIC_OR_DEPARTMENT_OTHER)
Admission: EM | Admit: 2017-08-20 | Discharge: 2017-08-20 | Disposition: A | Discharge status: Home / Self Care | Payer: 59 | Attending: Emergency Medicine | Admitting: Emergency Medicine

## 2017-08-20 DIAGNOSIS — M79672 Pain in left foot: Secondary | ICD-10-CM | POA: Insufficient documentation

## 2017-08-20 DIAGNOSIS — R200 Anesthesia of skin: Secondary | ICD-10-CM | POA: Insufficient documentation

## 2017-08-20 DIAGNOSIS — M79671 Pain in right foot: Secondary | ICD-10-CM | POA: Insufficient documentation

## 2017-08-20 DIAGNOSIS — Z79899 Other long term (current) drug therapy: Secondary | ICD-10-CM | POA: Insufficient documentation

## 2017-08-20 DIAGNOSIS — R202 Paresthesia of skin: Secondary | ICD-10-CM | POA: Insufficient documentation

## 2017-08-20 DIAGNOSIS — M79609 Pain in unspecified limb: Secondary | ICD-10-CM | POA: Diagnosis not present

## 2017-08-20 DIAGNOSIS — Z794 Long term (current) use of insulin: Secondary | ICD-10-CM | POA: Diagnosis not present

## 2017-08-20 DIAGNOSIS — Z3202 Encounter for pregnancy test, result negative: Secondary | ICD-10-CM | POA: Insufficient documentation

## 2017-08-20 DIAGNOSIS — M79641 Pain in right hand: Secondary | ICD-10-CM | POA: Diagnosis not present

## 2017-08-20 DIAGNOSIS — Z91040 Latex allergy status: Secondary | ICD-10-CM | POA: Insufficient documentation

## 2017-08-20 DIAGNOSIS — M79642 Pain in left hand: Secondary | ICD-10-CM | POA: Insufficient documentation

## 2017-08-20 DIAGNOSIS — E109 Type 1 diabetes mellitus without complications: Secondary | ICD-10-CM | POA: Diagnosis not present

## 2017-08-20 DIAGNOSIS — E119 Type 2 diabetes mellitus without complications: Secondary | ICD-10-CM | POA: Diagnosis not present

## 2017-08-20 DIAGNOSIS — Z87891 Personal history of nicotine dependence: Secondary | ICD-10-CM | POA: Insufficient documentation

## 2017-08-20 LAB — COMPREHENSIVE METABOLIC PANEL
ALT: 26 U/L (ref 14–54)
AST: 30 U/L (ref 15–41)
Albumin: 3.4 g/dL — ABNORMAL LOW (ref 3.5–5.0)
Alkaline Phosphatase: 132 U/L — ABNORMAL HIGH (ref 38–126)
Anion gap: 10 (ref 5–15)
BUN: 7 mg/dL (ref 6–20)
CHLORIDE: 103 mmol/L (ref 101–111)
CO2: 25 mmol/L (ref 22–32)
CREATININE: 0.58 mg/dL (ref 0.44–1.00)
Calcium: 8.1 mg/dL — ABNORMAL LOW (ref 8.9–10.3)
Glucose, Bld: 248 mg/dL — ABNORMAL HIGH (ref 65–99)
Potassium: 3.9 mmol/L (ref 3.5–5.1)
Sodium: 138 mmol/L (ref 135–145)
TOTAL PROTEIN: 6.5 g/dL (ref 6.5–8.1)
Total Bilirubin: 0.3 mg/dL (ref 0.3–1.2)

## 2017-08-20 LAB — URINALYSIS, ROUTINE W REFLEX MICROSCOPIC
BILIRUBIN URINE: NEGATIVE
Glucose, UA: 250 mg/dL — AB
Hgb urine dipstick: NEGATIVE
KETONES UR: NEGATIVE mg/dL
LEUKOCYTES UA: NEGATIVE
NITRITE: NEGATIVE
PH: 6 (ref 5.0–8.0)
PROTEIN: NEGATIVE mg/dL
Specific Gravity, Urine: 1.025 (ref 1.005–1.030)

## 2017-08-20 LAB — PREGNANCY, URINE: PREG TEST UR: NEGATIVE

## 2017-08-20 LAB — CBG MONITORING, ED: GLUCOSE-CAPILLARY: 228 mg/dL — AB (ref 65–99)

## 2017-08-20 LAB — CBC WITH DIFFERENTIAL/PLATELET
BASOS ABS: 0 10*3/uL (ref 0.0–0.1)
Basophils Relative: 0 %
EOS ABS: 0.2 10*3/uL (ref 0.0–0.7)
Eosinophils Relative: 2 %
HEMATOCRIT: 37.9 % (ref 36.0–46.0)
Hemoglobin: 13 g/dL (ref 12.0–15.0)
LYMPHS ABS: 1.6 10*3/uL (ref 0.7–4.0)
LYMPHS PCT: 23 %
MCH: 28.8 pg (ref 26.0–34.0)
MCHC: 34.3 g/dL (ref 30.0–36.0)
MCV: 84 fL (ref 78.0–100.0)
Monocytes Absolute: 0.5 10*3/uL (ref 0.1–1.0)
Monocytes Relative: 7 %
Neutro Abs: 4.9 10*3/uL (ref 1.7–7.7)
Neutrophils Relative %: 68 %
Platelets: 303 10*3/uL (ref 150–400)
RBC: 4.51 MIL/uL (ref 3.87–5.11)
RDW: 12.9 % (ref 11.5–15.5)
WBC: 7.2 10*3/uL (ref 4.0–10.5)

## 2017-08-20 LAB — SEDIMENTATION RATE: SED RATE: 22 mm/h (ref 0–22)

## 2017-08-20 LAB — MAGNESIUM: Magnesium: 1.7 mg/dL (ref 1.7–2.4)

## 2017-08-20 LAB — CK: CK TOTAL: 39 U/L (ref 38–234)

## 2017-08-20 MED ORDER — SODIUM CHLORIDE 0.9 % IV BOLUS
1000.0000 mL | Freq: Once | INTRAVENOUS | Status: AC
Start: 2017-08-20 — End: 2017-08-20
  Administered 2017-08-20: 1000 mL via INTRAVENOUS

## 2017-08-20 MED ORDER — PREDNISONE 50 MG PO TABS: 50.0000 mg | ORAL_TABLET | Freq: Every day | ORAL | 0 refills | Status: DC

## 2017-08-20 MED ORDER — METHYLPREDNISOLONE SODIUM SUCC 125 MG IJ SOLR
125.0000 mg | Freq: Once | INTRAMUSCULAR | Status: AC
Start: 2017-08-20 — End: 2017-08-20
  Administered 2017-08-20: 125 mg via INTRAVENOUS
  Filled 2017-08-20: qty 2

## 2017-08-20 NOTE — ED Notes (Signed)
ED Provider at bedside. 

## 2017-08-20 NOTE — ED Provider Notes (Signed)
[facility] EMERGENCY DEPARTMENT Provider Note   CSN: [id number] Arrival date & time: 08/20/17  0442     History   Chief Complaint Chief Complaint  Patient presents with  . Hand Pain    weakness    HPI Ashley [name] is a 37 y.o. female.  The history is provided by the patient.  She has history of diabetes and comes in with onset yesterday morning of numbness and pain in her feet and hands.  This is described as a dull pain.  Pain started to radiate up into her back and neck.  She denies any weakness.  Pain is rated at 8/10.  She has been taking ibuprofen 800 mg every 8 hours which gives slight, temporary relief.  She denies fever, chills, sweats.  She denies nausea or vomiting.  She denies any unusual activity.  She denies any change in her medications.  She did go to an urgent care center where many tests were done and she was given a prescription for gabapentin, but she has not taken any of it.  Of note, she did have a prior episode which was diagnosed as a demyelinating condition, but felt not to be multiple sclerosis at that time.  Past Medical History:  Diagnosis Date  . Diabetes mellitus without complication ([place])   . Thyroid disease     Patient Active Problem List   Diagnosis Date Noted  . Palpitations 04/12/2013  . Obesity (BMI 30-39.9) 04/12/2013  . Diabetes mellitus type 1 ([place]) 04/12/2013    Past Surgical History:  Procedure Laterality Date  . CESAREAN SECTION    . COMBINED HYSTEROSCOPY DIAGNOSTIC / D&C    . THYROIDECTOMY    . TONSILLECTOMY       OB History   None      Home Medications    Prior to Admission medications   Medication Sig Start Date End Date Taking? Authorizing Provider  calcitRIOL (ROCALTROL) 0.5 MCG capsule Take 0.5 mcg by mouth 2 (two) times daily.   Yes [provider]  Calcium Carbonate-Vitamin D (CALCIUM-D) 600-400 MG-UNIT TABS Take 1 tablet by mouth 3 (three) times daily.   Yes [provider]    FLUoxetine HCl (PROZAC PO) Take by mouth.   Yes [provider]  insulin lispro (HUMALOG) 100 UNIT/ML injection Pt is on a pump   Yes [provider]  Saxagliptin-Metformin 5-500 MG TB24 Take 500 mg by mouth daily.   Yes [provider]  escitalopram (LEXAPRO) 10 MG tablet Take 10 mg by mouth daily. 04/09/13   [provider]  levothyroxine (SYNTHROID, LEVOTHROID) 150 MCG tablet Take 150 mcg by mouth daily before breakfast. daily    [provider]  levothyroxine (SYNTHROID, LEVOTHROID) 175 MCG tablet Take 175 mcg by mouth daily. Mon through Fri    [provider]  nebivolol (BYSTOLIC) 5 MG tablet Take 5 mg by mouth daily.    [provider]  Norethindrone-Ethinyl Estradiol-Fe Biphas (LO LOESTRIN FE) 1 MG-10 MCG / 10 MCG tablet Take 1 tablet by mouth daily.    [provider]  vitamin B-12 (CYANOCOBALAMIN) 1000 MCG tablet Take 1,000 mcg by mouth daily.    [provider]    Family History Family History  Problem Relation Age of Onset  . Hypertension Mother   . Diabetes Mother   . Thyroid cancer Mother   . Heart attack Father   . Diabetes Father   . Hyperlipidemia Father   . Stroke Father   .  Heart Problems Brother        pvc's  . Lung cancer Maternal Grandmother   . Heart attack Maternal Grandmother   . Diabetes Maternal Grandfather   . Heart attack Maternal Grandfather   . Diabetes Paternal Grandmother   . Heart attack Paternal Grandmother     Social History Social History   Tobacco Use  . Smoking status: Former Smoker    Types: Cigarettes    Last attempt to quit: 03/04/1998    Years since quitting: 19.4  . Smokeless tobacco: Never Used  Substance Use Topics  . Alcohol use: Never    Frequency: Never  . Drug use: Never     Allergies   Esomeprazole magnesium; Nitrofurantoin monohyd macro; Penicillins; and Latex   Review of Systems Review of Systems  All other systems reviewed and are  negative.   Physical Exam Updated Vital Signs BP 138/85 (BP Location: Right Arm)   Pulse 90   Temp 98.3 F (36.8 C) (Oral)   Resp 18   Ht 5\' 7"  (1.702 m)   Wt 99.8 kg (220 lb)   SpO2 96%   BMI 34.46 kg/m   Physical Exam  Nursing note and vitals reviewed.  37 year old female, resting comfortably and in no acute distress. Vital signs are normal. Oxygen saturation is 96%, which is normal. Head is normocephalic and atraumatic. PERRLA, EOMI. Oropharynx is clear. Neck is nontender and supple without adenopathy or JVD. Back is nontender and there is no CVA tenderness. Lungs are clear without rales, wheezes, or rhonchi. Chest is nontender. Heart has regular rate and rhythm without murmur. Abdomen is soft, flat, nontender without masses or hepatosplenomegaly and peristalsis is normoactive. Extremities have trace edema, full range of motion is present. Skin is warm and dry without rash. Neurologic: Mental status is normal, cranial nerves are intact, there are no motor or sensory deficits.  Strength is 5/5 in both arms and both legs.  Careful sensory exam shows no area of hypoesthesia.   ED Treatments / Results  Labs (all labs ordered are listed, but only abnormal results are displayed) Labs Reviewed  CBG MONITORING, ED - Abnormal; Notable for the following components:      Result Value   Glucose-Capillary 228 (*)    All other components within normal limits    EKG EKG Interpretation  Date/Time:  Wednesday August 20 2017 04:56:09 EDT Ventricular Rate:  80 PR Interval:    QRS Duration: 95 QT Interval:  390 QTC Calculation: 450 R Axis:   73 Text Interpretation:  Sinus rhythm Normal ECG When compared with ECG of 02/25/2007, No significant change was found Confirmed by Delora Fuel ([id number]) on 08/20/2017 5:00:41 AM  Procedures Procedures   Medications Ordered in ED Medications  sodium chloride 0.9 % bolus 1,000 mL (0 mLs Intravenous Stopped 08/20/17 0612)  methylPREDNISolone  sodium succinate (SOLU-MEDROL) 125 mg/2 mL injection 125 mg (125 mg Intravenous Given 08/20/17 0612)     Initial Impression / Assessment and Plan / ED Course  I have reviewed the triage vital signs and the nursing notes.  Pertinent labs & imaging results that were available during my care of the patient were reviewed by me and considered in my medical decision making (see chart for details).  Numbness and pain uncertain cause.  No clues on physical exam.  With history of demyelinating condition years ago, need to consider possible multiple sclerosis.  Will check screening labs.  Old records are reviewed, and she has no relevant past  visits.  Labs are unrevealing.  CK is normal, WBC is normal with normal differential.  Mild elevation of alkaline phosphatase is not felt to be clinically significant.  Sedimentation rate is normal.  With her history of having had a demyelinating condition in the past, I suspect that this might be another demyelinating event.  She is given a dose of methylprednisolone and will be sent home with a short course of prednisone.  She had seen neurology at [facility] in the past, and she is referred back to them for further outpatient work-up.  Final Clinical Impressions(s) / ED Diagnoses   Final diagnoses:  Pain in both hands  Pain in both feet  Numbness and tingling in both hands  Numbness and tingling of both feet    ED Discharge Orders        Ordered    predniSONE (DELTASONE) 50 MG tablet  Daily     03/88/82 8003       Delora Fuel, MD 49/17/91 [phone]

## 2017-08-20 NOTE — ED Triage Notes (Addendum)
Bil hand and feet pain and swelling. Tingling to hands since yesterday, also having pain to left side of neck with pain that radiates to left arm, hand . Having difficulty with walking, weakness to legs and swelling to feet, since yesterday am . Denies recent tick bite . Was seen at [place] last night and had labs drawn , ? Results , states feels like her hands are "drawing up"

## 2017-08-20 NOTE — Discharge Instructions (Signed)
Your tests in the emergency department did not show a clear cause for your pain.  I suspect that pain may be related to the prior episode of demyelination that you had.  Please take the prednisone once a day.  You may continue to take ibuprofen and/or acetaminophen as needed for pain control.  Make an appointment with the neurologist for further outpatient work-up.

## 2017-08-26 ENCOUNTER — Ambulatory Visit: Payer: 59 | Admitting: Neurology

## 2017-08-26 ENCOUNTER — Encounter: Payer: Self-pay | Admitting: Neurology

## 2017-08-26 DIAGNOSIS — R252 Cramp and spasm: Secondary | ICD-10-CM

## 2017-08-26 DIAGNOSIS — R208 Other disturbances of skin sensation: Secondary | ICD-10-CM | POA: Diagnosis not present

## 2017-08-26 DIAGNOSIS — R9089 Other abnormal findings on diagnostic imaging of central nervous system: Secondary | ICD-10-CM | POA: Diagnosis not present

## 2017-08-26 DIAGNOSIS — R768 Other specified abnormal immunological findings in serum: Secondary | ICD-10-CM | POA: Diagnosis not present

## 2017-08-26 NOTE — Progress Notes (Signed)
[facility]  PATIENT: Ashley [name] DOB: [birth year]  REFERRING DOCTOR OR PCP: Leighton Ruff, MD SOURCE: Patient, notes from Dr. Drema Dallas,  _________________________________   HISTORICAL  CHIEF COMPLAINT:  Chief Complaint  Patient presents with  . New Patient (Initial Visit)    Pt reports a sudden onset on pain in her hands and feet. It got a little better on the prednisone.     HISTORY OF PRESENT ILLNESS:  I had the pleasure seeing a patient, Naomie Crow, at [facility] for neurologic consultation regarding her dysesthesias in the hands and feet.  Last week, she had a very cold painful sensation in her hands and feet and had chills with shaking.   This occurred a few nights in a row and then she had a tight spasticity in her left hands.   She had swelling in the feet > hands.     She went to [facility] but felt worse that night so she went to the ED.   She receive a dose of IV Solu-medrol followed by 50 mg prednisone x 5 days.      She is feeling much better but still has aching in her neck and her hands, right > left.    There are no cognitive changes.   Her daughter is just getting over Fifth disease.  I reviewed lab work from her hospital admission as well as the urgent care visit.  Of note, the rheumatoid factor was slightly elevated at 14.2.  Glucose was elevated.  In December 2008, two weeks after delivering, she was completely numb oin her right side and was less coherent and mildly confused.   She improved as the day went on but got worse that night.   She had spells of altered consciousness and was felt to have had possible seizures.   She had phasic spasms in her  She had Solu-Medrol and started to feel better.  Over 2-3 weeks she returned towards baseline.   However, subtle personality changes took a few months to return to baseline.    She saw DrBrett Fairy and she had an LP which was normal.   She had a follow-up MRI February 2009 and the  abnormalities had cleared.       She had a thyroidectomy for follicular and papillary cancer.   She had parathyroids removed, too.    She takes calcium supplements and vit D.   In 20121, calcium was low at 7.3 but was 8.1 more recently.    I personally reviewed the MRIs of the brain from 02/25/2007, 02/27/2007 and 04/20/2007.  The 2 MRIs from December show diffusion weighted hyperintensity in the middle of the splenium and along the cortical spinal tracts bilaterally.  Flair images confirm this.  The February 2009 MRI was back to baseline.  Of note, there were no other lesions and no abnormal enhancement.  Note from that hospital admission and radiology reports imply concern for stroke, ADEM and multiple sclerosis.   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  She is a type I diabetic.  REVIEW OF SYSTEMS: Constitutional: No fevers, chills, sweats, or change in appetite Eyes: No visual changes, double vision, eye pain Ear, nose and throat: No hearing loss, ear pain, nasal congestion, sore throat Cardiovascular: No chest pain, palpitations Respiratory: No shortness of breath at rest or with  exertion.   No wheezes GastrointestinaI: No nausea, vomiting, diarrhea, abdominal pain, fecal incontinence Genitourinary: No dysuria, urinary retention or frequency.  No nocturia. Musculoskeletal: No current neck pain, back pain Integumentary: No rash, pruritus, skin lesions Neurological: as above Psychiatric: No depression at this time.  No anxiety Endocrine: Type 1 diabetes. Hematologic/Lymphatic: No anemia, purpura, petechiae. Allergic/Immunologic: No itchy/runny eyes, nasal congestion, recent allergic reactions, rashes  ALLERGIES: Allergies  Allergen Reactions  . Esomeprazole Magnesium Hives    Avoid all PPI's  .  Nitrofurantoin Monohyd Macro Nausea And Vomiting  . Penicillins Other (See Comments)    child  . Latex Rash    Avoid adhesives also    HOME MEDICATIONS:  Current Outpatient Medications:  .  calcitRIOL (ROCALTROL) 0.5 MCG capsule, Take 0.5 mcg by mouth 2 (two) times daily., Disp: , Rfl:  .  Calcium Carbonate-Vitamin D (CALCIUM-D) 600-400 MG-UNIT TABS, Take 1 tablet by mouth 3 (three) times daily., Disp: , Rfl:  .  FLUoxetine HCl (PROZAC PO), Take 40 mg by mouth daily. , Disp: , Rfl:  .  insulin lispro (HUMALOG) 100 UNIT/ML injection, Pt is on a pump, Disp: , Rfl:  .  levothyroxine (SYNTHROID, LEVOTHROID) 137 MCG tablet, Take 137 mcg by mouth daily. as directed, Disp: , Rfl: 3 .  Saxagliptin-Metformin 5-500 MG TB24, Take 500 mg by mouth daily., Disp: , Rfl:  .  vitamin B-12 (CYANOCOBALAMIN) 1000 MCG tablet, Take 1,000 mcg by mouth daily., Disp: , Rfl:   PAST MEDICAL HISTORY: Past Medical History:  Diagnosis Date  . Diabetes mellitus without complication ([place])   . Thyroid disease     PAST SURGICAL HISTORY: Past Surgical History:  Procedure Laterality Date  . CESAREAN SECTION    . COMBINED HYSTEROSCOPY DIAGNOSTIC / D&C    . THYROIDECTOMY    . TONSILLECTOMY      FAMILY HISTORY: Family History  Problem Relation Age of Onset  . Hypertension Mother   . Diabetes Mother   . Thyroid cancer Mother   . Heart attack Father   . Diabetes Father   . Hyperlipidemia Father   . Stroke Father   . Heart Problems Brother        pvc's  . Lung cancer Maternal Grandmother   . Heart attack Maternal Grandmother   . Diabetes Maternal Grandfather   . Heart attack Maternal Grandfather   . Diabetes Paternal Grandmother   . Heart attack Paternal Grandmother     SOCIAL HISTORY:  Social History   Socioeconomic History  . Marital status: Married    Spouse name: Not on file  . Number of children: Not on file  . Years of education: Not on file  . Highest education level: Not on file    Occupational History  . Not on file  Social Needs  . Financial resource strain: Not on file  . Food insecurity:    Worry: Not on file    Inability: Not on file  . Transportation needs:    Medical: Not on file    Non-medical: Not on file  Tobacco Use  . Smoking status: Former Smoker    Types: Cigarettes    Last attempt to quit: 03/04/1998    Years since quitting: 19.4  . Smokeless tobacco: Never Used  Substance and Sexual Activity  . Alcohol use: Never    Frequency: Never  . Drug use: Never  . Sexual activity: Yes    Birth control/protection: IUD  Lifestyle  . Physical activity:    Days  per week: Not on file    Minutes per session: Not on file  . Stress: Not on file  Relationships  . Social connections:    Talks on phone: Not on file    Gets together: Not on file    Attends religious service: Not on file    Active member of club or organization: Not on file    Attends meetings of clubs or organizations: Not on file    Relationship status: Not on file  . Intimate partner violence:    Fear of current or ex partner: Not on file    Emotionally abused: Not on file    Physically abused: Not on file    Forced sexual activity: Not on file  Other Topics Concern  . Not on file  Social History Narrative  . Not on file     PHYSICAL EXAM  Vitals:   08/26/17 1040  BP: (!) 133/91  Pulse: 80  Weight: 221 lb (100.2 kg)  Height: 5\' 7"  (1.702 m)    Body mass index is 34.61 kg/m.   General: The patient is well-developed and well-nourished and in no acute distress  Eyes:  Funduscopic exam shows normal optic discs and retinal vessels.  Neck: The neck is supple, no carotid bruits are noted.  The neck is nontender.  Cardiovascular: The heart has a regular rate and rhythm with a normal S1 and S2. There were no murmurs, gallops or rubs. Lungs are clear to auscultation.  Skin: Extremities are without significant edema.  Musculoskeletal:  Back is nontender  Neurologic  Exam  Mental status: The patient is alert and oriented x 3 at the time of the examination. The patient has apparent normal recent and remote memory, with an apparently normal attention span and concentration ability.   Speech is normal.  Cranial nerves: Extraocular movements are full. Pupils are equal, round, and reactive to light and accomodation.  Visual fields are full.  Facial symmetry is present. There is good facial sensation to soft touch bilaterally.Facial strength is normal.  Trapezius and sternocleidomastoid strength is normal. No dysarthria is noted.  The tongue is midline, and the patient has symmetric elevation of the soft palate. No obvious hearing deficits are noted.  Motor:  Muscle bulk is normal.   Tone is normal. Strength is  5 / 5 in all 4 extremities.   Sensory: Sensory testing is intact to pinprick, soft touch and vibration sensation in all 4 extremities.  Coordination: Cerebellar testing reveals good finger-nose-finger and heel-to-shin bilaterally.  Gait and station: Station is normal.   Gait is normal. Tandem gait is normal. Romberg is negative.   Reflexes: Deep tendon reflexes are symmetric and normal bilaterally.   Plantar responses are flexor.    DIAGNOSTIC DATA (LABS, IMAGING, TESTING) - I reviewed patient records, labs, notes, testing and imaging myself where available.  Lab Results  Component Value Date   WBC 7.2 08/20/2017   HGB 13.0 08/20/2017   HCT 37.9 08/20/2017   MCV 84.0 08/20/2017   PLT 303 08/20/2017      Component Value Date/Time   NA 138 08/20/2017 0503   K 3.9 08/20/2017 0503   CL 103 08/20/2017 0503   CO2 25 08/20/2017 0503   GLUCOSE 248 (H) 08/20/2017 0503   BUN 7 08/20/2017 0503   CREATININE 0.58 08/20/2017 0503   CALCIUM 8.1 (L) 08/20/2017 0503   PROT 6.5 08/20/2017 0503   ALBUMIN 3.4 (L) 08/20/2017 0503   AST 30 08/20/2017 0503  ALT 26 08/20/2017 0503   ALKPHOS 132 (H) 08/20/2017 0503   BILITOT 0.3 08/20/2017 0503   GFRNONAA  >60 08/20/2017 0503   GFRAA >60 08/20/2017 0503   Lab Results  Component Value Date   CHOL (H) 02/26/2007    220        ATP III CLASSIFICATION:  <200     mg/dL   Desirable  200-239  mg/dL   Borderline High  >=240    mg/dL   High   HDL 55 02/26/2007   LDLCALC (H) 02/26/2007    149        Total Cholesterol/HDL:CHD Risk Coronary Heart Disease Risk Table                     Men   Women  1/2 Average Risk   3.4   3.3   TRIG 79 02/26/2007   CHOLHDL 4.0 02/26/2007   Lab Results  Component Value Date   HGBA1C  02/25/2007    5.6 (NOTE)   The ADA recommends the following therapeutic goals for glycemic   control related to Hgb A1C measurement:   Goal of Therapy:   < 7.0% Hgb A1C   Action Suggested:  > 8.0% Hgb A1C   Ref:  Diabetes Care, 22, Suppl. 1, 1999   Lab Results  Component Value Date   [id number] 950 02/27/2007   No results found for: TSH     ASSESSMENT AND PLAN  Dysesthesia - Plan: MR BRAIN W WO CONTRAST, MR CERVICAL SPINE W WO CONTRAST  Spasticity - Plan: MR BRAIN W WO CONTRAST, MR CERVICAL SPINE W WO CONTRAST  Abnormal brain MRI - Plan: MR BRAIN W WO CONTRAST  Elevated rheumatoid factor    In summary, Mrs. Erck is a 37 year old woman who had painful dysesthesias in her hands and feet and phasic spasms in her hands last week who is doing better since being placed on steroids.   Interestingly, she had sensory symptoms combined with altered awareness episodes 10 years ago 2 weeks postpartum that were associated with abnormal MRI findings on the brain involving the splenium and the cortical spinal tract.  She was felt to possibly have either a stroke, ADEM or MS.  By my review of the images, I think those possibilities are all unlikely and that the abnormalities most likely represent either cytotoxic lesion of the corpus callosum or postpartum hypernatremic cerebral encephalopathy with osmotic demyelination .   Regardless, she improved with the initial event and the more  recent event.  We need to check an MRI of the brain and cervical spine to determine if she has any evidence of demyelination or other CNS process and also to rule out myelopathy.   Her rheumatoid factor was just slightly elevated.  She does not have evidence of or a on examination today.  However, if she experiences more joint pain or if she has other evidence, consider referral to rheumatology.    She will return to see me in 3 months or sooner if there are new or worsening neurologic symptoms.  Thank you for asking me to see Mrs. Goh for neurologic consultation.  Please let me know if I can be of further assistance with her or other patients in the future.   Eufemio Strahm A. Felecia Shelling, MD, [facility] 9/32/6712, 45:80 AM Certified in Neurology, Clinical Neurophysiology, Sleep Medicine, Pain Medicine and Neuroimaging  [facility] [address], [place], [place] [address] [phone]

## 2017-08-27 ENCOUNTER — Telehealth: Payer: Self-pay | Admitting: Neurology

## 2017-08-27 MED ORDER — PREDNISONE 20 MG PO TABS: ORAL_TABLET | ORAL | 0 refills | Status: DC

## 2017-08-27 NOTE — Telephone Encounter (Signed)
Pt said she finished prednisone on Monday. The pain returned again last night, she is asking for a refill for prednisone sent to [facility]/[address]. Please call to advise

## 2017-08-27 NOTE — Telephone Encounter (Signed)
MR Brain w/wo contrast & MR Cervical spine w/wo contrast Dr. Felecia Shelling [employer] Auth: [place] via [employer] website. Pt is scheduled at [facility] for 09/02/17.

## 2017-08-27 NOTE — Telephone Encounter (Signed)
Patient called and stated she wants it done at [facility] she can get it done tom. 08/28/17. Faxed orders.

## 2017-08-27 NOTE — Telephone Encounter (Signed)
Spoke with [employer].  She had been taking Prednisone 50mg  daily.  Dr. Felecia Shelling feels she may do better if she tapers off of Predisone, so he rec. Prednisone 20mg , 3 tabs daily on days 1-3, 2 tabs daily on days 4-6, 1 tab daily on days 7-9, and 1/2 tab daily on days 10-12.  She verbalized understanding of same, is agreeable with this plan.  Rx. escribed to [facility] per her request/fim

## 2017-08-28 ENCOUNTER — Telehealth: Payer: Self-pay | Admitting: Neurology

## 2017-08-28 NOTE — Telephone Encounter (Signed)
Pt has called to inform that she went for her MRI's this morning and was told that due to her braces the views are distorted.  Pt asking if Dr Felecia Shelling wants to continue to distorted view or order CT please call pt back

## 2017-08-28 NOTE — Telephone Encounter (Signed)
Spoke with [facility] and explained that RAS is aware there will be some interference with her braces, but he needs the MRI.  A CT will not be as helpful.  She verbalized understanding of same, will r/s MRI.  She is having this done at Ortho, and is aware she will need to bring a CD to our office since Dr. Felecia Shelling will not be able to view the images otherwise/fim

## 2017-08-29 DIAGNOSIS — R200 Anesthesia of skin: Secondary | ICD-10-CM | POA: Diagnosis not present

## 2017-09-02 ENCOUNTER — Other Ambulatory Visit: Payer: 59

## 2017-09-08 NOTE — Telephone Encounter (Signed)
Patient is calling  to get MRI results. She did bring disc to our office. Also yesterday was her last day of taking predniSONE (DELTASONE) 20 MG tablet and wants to know if she needs to continue taking it.

## 2017-09-08 NOTE — Telephone Encounter (Signed)
LMOM (identified vm), that she can stop Prednisone once done with taper pack, and that RAS was ooo last wk, so if she dropped the disc off during that time, as soon as he's looked at it, will call her with the results/fim

## 2017-09-09 ENCOUNTER — Telehealth: Payer: Self-pay | Admitting: *Deleted

## 2017-09-09 NOTE — Telephone Encounter (Signed)
-----   Message from Britt Bottom, MD sent at 09/08/2017  7:15 PM EDT ----- Please let her know that I took a look at the MRIs of the brain and cervical spine done at orthopedics.  They are normal so there is no evidence of MS.

## 2017-09-09 NOTE — Telephone Encounter (Signed)
Ashley [name] (identified vm) with below MRI results.  She does not need to return this call unless she has questions/fim

## 2017-09-18 DIAGNOSIS — E109 Type 1 diabetes mellitus without complications: Secondary | ICD-10-CM | POA: Diagnosis not present

## 2017-09-21 DIAGNOSIS — E109 Type 1 diabetes mellitus without complications: Secondary | ICD-10-CM | POA: Diagnosis not present

## 2017-10-07 DIAGNOSIS — Z01419 Encounter for gynecological examination (general) (routine) without abnormal findings: Secondary | ICD-10-CM | POA: Diagnosis not present

## 2017-10-09 DIAGNOSIS — M2550 Pain in unspecified joint: Secondary | ICD-10-CM | POA: Diagnosis not present

## 2017-10-20 DIAGNOSIS — E890 Postprocedural hypothyroidism: Secondary | ICD-10-CM | POA: Diagnosis not present

## 2017-10-20 DIAGNOSIS — E1065 Type 1 diabetes mellitus with hyperglycemia: Secondary | ICD-10-CM | POA: Diagnosis not present

## 2017-10-22 DIAGNOSIS — M2550 Pain in unspecified joint: Secondary | ICD-10-CM | POA: Diagnosis not present

## 2017-10-22 DIAGNOSIS — R768 Other specified abnormal immunological findings in serum: Secondary | ICD-10-CM | POA: Diagnosis not present

## 2017-12-15 DIAGNOSIS — E109 Type 1 diabetes mellitus without complications: Secondary | ICD-10-CM | POA: Diagnosis not present

## 2018-01-14 DIAGNOSIS — R768 Other specified abnormal immunological findings in serum: Secondary | ICD-10-CM | POA: Diagnosis not present

## 2018-01-14 DIAGNOSIS — M2550 Pain in unspecified joint: Secondary | ICD-10-CM | POA: Diagnosis not present

## 2018-03-11 DIAGNOSIS — E109 Type 1 diabetes mellitus without complications: Secondary | ICD-10-CM | POA: Diagnosis not present

## 2018-03-31 DIAGNOSIS — J069 Acute upper respiratory infection, unspecified: Secondary | ICD-10-CM | POA: Diagnosis not present

## 2018-03-31 DIAGNOSIS — E109 Type 1 diabetes mellitus without complications: Secondary | ICD-10-CM | POA: Diagnosis not present

## 2018-04-27 DIAGNOSIS — M722 Plantar fascial fibromatosis: Secondary | ICD-10-CM | POA: Diagnosis not present

## 2018-05-01 DIAGNOSIS — E1065 Type 1 diabetes mellitus with hyperglycemia: Secondary | ICD-10-CM | POA: Diagnosis not present

## 2018-05-01 DIAGNOSIS — E890 Postprocedural hypothyroidism: Secondary | ICD-10-CM | POA: Diagnosis not present

## 2018-05-01 DIAGNOSIS — C73 Malignant neoplasm of thyroid gland: Secondary | ICD-10-CM | POA: Diagnosis not present

## 2018-06-08 DIAGNOSIS — E109 Type 1 diabetes mellitus without complications: Secondary | ICD-10-CM | POA: Diagnosis not present

## 2021-01-05 ENCOUNTER — Telehealth: Payer: Self-pay | Admitting: Cardiovascular Disease

## 2021-01-05 NOTE — Telephone Encounter (Signed)
New Message:      Patient would like to switch from Dr Claiborne Billings service to Dr Sallyanne Kuster service. Is this alright with the both of you?

## 2021-01-12 NOTE — Telephone Encounter (Signed)
Ok by me

## 2021-01-29 ENCOUNTER — Ambulatory Visit: Payer: Self-pay | Admitting: Cardiology

## 2021-01-29 ENCOUNTER — Ambulatory Visit: Payer: 59 | Admitting: Cardiovascular Disease

## 2021-02-08 ENCOUNTER — Ambulatory Visit: Payer: 59 | Admitting: Cardiovascular Disease

## 2021-02-20 ENCOUNTER — Other Ambulatory Visit: Payer: Self-pay

## 2021-02-20 ENCOUNTER — Ambulatory Visit (INDEPENDENT_AMBULATORY_CARE_PROVIDER_SITE_OTHER): Payer: 59 | Admitting: Cardiovascular Disease

## 2021-02-20 ENCOUNTER — Encounter: Payer: Self-pay | Admitting: Cardiovascular Disease

## 2021-02-20 ENCOUNTER — Ambulatory Visit (INDEPENDENT_AMBULATORY_CARE_PROVIDER_SITE_OTHER): Payer: 59

## 2021-02-20 VITALS — BP 130/71 | HR 91 | Ht 67.0 in | Wt 241.2 lb

## 2021-02-20 DIAGNOSIS — I341 Nonrheumatic mitral (valve) prolapse: Secondary | ICD-10-CM | POA: Diagnosis not present

## 2021-02-20 DIAGNOSIS — R002 Palpitations: Secondary | ICD-10-CM

## 2021-02-20 DIAGNOSIS — E039 Hypothyroidism, unspecified: Secondary | ICD-10-CM

## 2021-02-20 DIAGNOSIS — E7800 Pure hypercholesterolemia, unspecified: Secondary | ICD-10-CM

## 2021-02-20 DIAGNOSIS — E109 Type 1 diabetes mellitus without complications: Secondary | ICD-10-CM | POA: Diagnosis not present

## 2021-02-20 MED ORDER — METOPROLOL TARTRATE 25 MG PO TABS: 25.0000 mg | ORAL_TABLET | Freq: Three times a day (TID) | ORAL | 0 refills | Status: DC | PRN

## 2021-02-20 NOTE — Patient Instructions (Addendum)
Medication Instructions:  TAKE Metoprolol Tartrate 25 mg as needed (up to three times daily) for palpitations.  *If you need a refill on your cardiac medications before your next appointment, please call your pharmacy*   Lab Work: None ordered If you have labs (blood work) drawn today and your tests are completely normal, you will receive your results only by: [place] (if you have MyChart) OR A paper copy in the mail If you have any lab test that is abnormal or we need to change your treatment, we will call you to review the results.   Testing/Procedures: Your physician has requested that you have an echocardiogram. Echocardiography is a painless test that uses sound waves to create images of your heart. It provides your doctor with information about the size and shape of your heart and how well your hearts chambers and valves are working. You may receive an ultrasound enhancing agent through an IV if needed to better visualize your heart during the echo.This procedure takes approximately one hour. There are no restrictions for this procedure. This will take place at the [address].   ZIO XT- Long Term Monitor Instructions  Your physician has requested you wear a ZIO patch monitor for 14 days.  This is a single patch monitor. [employer] supplies one patch monitor per enrollment. Additional stickers are not available. Please do not apply patch if you will be having a Nuclear Stress Test,  Echocardiogram, Cardiac CT, MRI, or Chest Xray during the period you would be wearing the  monitor. The patch cannot be worn during these tests. You cannot remove and re-apply the  ZIO XT patch monitor.  Your ZIO patch monitor will be mailed 3 day USPS to your address on file. It may take 3-5 days  to receive your monitor after you have been enrolled.  Once you have received your monitor, please review the enclosed instructions. Your monitor  has already been registered assigning a  specific monitor serial # to you.  Billing and Patient Assistance Program Information  We have supplied [employer] with any of your insurance information on file for billing purposes. [employer] offers a sliding scale Patient Assistance Program for patients that do not have  insurance, or whose insurance does not completely cover the cost of the ZIO monitor.  You must apply for the Patient Assistance Program to qualify for this discounted rate.  To apply, please call [employer] at [phone], select option 4, select option 2, ask to apply for  Patient Assistance Program. Theodore Demark will ask your household income, and how many people  are in your household. They will quote your out-of-pocket cost based on that information.  [employer] will also be able to set up a 18-month, interest-free payment plan if needed.  Applying the monitor   Shave hair from upper left chest.  Hold abrader disc by orange tab. Rub abrader in 40 strokes over the upper left chest as  indicated in your monitor instructions.  Clean area with 4 enclosed alcohol pads. Let dry.  Apply patch as indicated in monitor instructions. Patch will be placed under collarbone on left  side of chest with arrow pointing upward.  Rub patch adhesive wings for 2 minutes. Remove white label marked "1". Remove the white  label marked "2". Rub patch adhesive wings for 2 additional minutes.  While looking in a mirror, press and release button in center of patch. A small green light will  flash 3-4 times. This will be your  only indicator that the monitor has been turned on.  Do not shower for the first 24 hours. You may shower after the first 24 hours.  Press the button if you feel a symptom. You will hear a small click. Record Date, Time and  Symptom in the Patient Logbook.  When you are ready to remove the patch, follow instructions on the last 2 pages of Patient  Logbook. Stick patch monitor onto the last page of Patient Logbook.  Place Patient  Logbook in the blue and white box. Use locking tab on box and tape box closed  securely. The blue and white box has prepaid postage on it. Please place it in the mailbox as  soon as possible. Your physician should have your test results approximately 7 days after the  monitor has been mailed back to [facility].  Call [place] at [phone] if you have questions regarding  your ZIO XT patch monitor. Call them immediately if you see an orange light blinking on your  monitor.  If your monitor falls off in less than 4 days, contact our Monitor department at [phone].  If your monitor becomes loose or falls off after 4 days call [employer] at [phone] for  suggestions on securing your monitor   Follow-Up: At [facility], you and your health needs are our priority.  As part of our continuing mission to provide you with exceptional heart care, we have created designated Provider Care Teams.  These Care Teams include your primary Cardiologist (physician) and Advanced Practice Providers (APPs -  Physician Assistants and Nurse Practitioners) who all work together to provide you with the care you need, when you need it.  We recommend signing up for the patient portal called "MyChart".  Sign up information is provided on this After Visit Summary.  MyChart is used to connect with patients for Virtual Visits (Telemedicine).  Patients are able to view lab/test results, encounter notes, upcoming appointments, etc.  Non-urgent messages can be sent to your provider as well.   To learn more about what you can do with MyChart, go to [email or web address].    Your next appointment:   12 month(s)  The format for your next appointment:   In Person  Provider:   Sanda Klein, MD

## 2021-02-20 NOTE — Progress Notes (Unsigned)
Enrolled for [employer] to mail a ZIO XT long term holter monitor to the patients address on file.  

## 2021-02-20 NOTE — Progress Notes (Signed)
Cardiology Office Note:    Date:  02/23/2021   ID:  [name], DOB 1981/03/01, MRN [id number]  PCP:  Leighton Ruff, MD (Inactive)   [facility] Providers Cardiologist:  Sanda Klein, MD     Referring MD: Leighton Ruff, MD   Chief Complaint  Patient presents with   Palpitations  Ashley [name] is a 40 y.o. female who is being seen today for the evaluation of palpitations at the request of Leighton Ruff, MD.   History of Present Illness:    Ashley [name] is a 40 y.o. female with a hx of 1 diabetes mellitus since age 28, currently on insulin pump, history of thyroid cancer status postsurgery and radioactive iodine ablation, currently on thyroid hormone supplement, hypercholesterolemia on statin.  She has been troubled by palpitations that seem to be more prominent at night when she tries to lie down at the end of the day.  They have been increasing in frequency recently.  Previously she tried to treat these with diltiazem but this caused lethargy and the medicine was stopped.  She has not had syncope.  She denies dizziness with the palpitations.  She does not have shortness of breath or chest pain at rest or with activity but has a fairly sedentary lifestyle.  She denies lower extremity edema, claudication or focal neurological events.  She had a recent appointment with Dr. Chalmers Cater in September when her TSH was within normal range.  However, her diabetes was poorly controlled with a hemoglobin A1c that had increased to 11%.  She was started on the insulin pump at that time and has noticed improved glycemic control.  Past Medical History:  Diagnosis Date   Diabetes mellitus without complication ([place])    Thyroid disease     Past Surgical History:  Procedure Laterality Date   CESAREAN SECTION     COMBINED HYSTEROSCOPY DIAGNOSTIC / D&C     THYROIDECTOMY     TONSILLECTOMY      Current Medications: Current Meds  Medication Sig   calcitRIOL (ROCALTROL) 0.5  MCG capsule Take 0.5 mcg by mouth 2 (two) times daily.   Calcium Carbonate-Vitamin D (CALCIUM-D) 600-400 MG-UNIT TABS Take 1 tablet by mouth 3 (three) times daily.   citalopram (CELEXA) 40 MG tablet citalopram 40 mg tablet  TAKE 1 TABLET BY MOUTH ONCE DAILY   insulin lispro (HUMALOG) 100 UNIT/ML injection Pt is on a pump   levothyroxine (SYNTHROID, LEVOTHROID) 137 MCG tablet Take 137 mcg by mouth daily. as directed   metoprolol tartrate (LOPRESSOR) 25 MG tablet Take 1 tablet (25 mg total) by mouth 3 (three) times daily as needed (For palpitations).   predniSONE (DELTASONE) 20 MG tablet Take 3 tablets daily on days 1-3.  Take 2 tablets daily on days 4-6.  Take 1 tablet daily on days 7-9.  Take 1/2 tablet daily on days 10-12.   rosuvastatin (CRESTOR) 5 MG tablet rosuvastatin 5 mg tablet  TAKE 1 TABLET BY MOUTH EVERY DAY   Saxagliptin-Metformin 5-500 MG TB24 Take 500 mg by mouth daily.     Allergies:   Esomeprazole magnesium, Nitrofurantoin monohyd macro, Penicillins, and Latex   Social History   Socioeconomic History   Marital status: Married    Spouse name: Not on file   Number of children: Not on file   Years of education: Not on file   Highest education level: Not on file  Occupational History   Not on file  Tobacco Use   Smoking status: Former  Types: Cigarettes    Quit date: 03/04/1998    Years since quitting: 22.9   Smokeless tobacco: Never  Substance and Sexual Activity   Alcohol use: Never   Drug use: Never   Sexual activity: Yes    Birth control/protection: I.U.D.  Other Topics Concern   Not on file  Social History Narrative   Not on file   Social Determinants of Health   Financial Resource Strain: Not on file  Food Insecurity: Not on file  Transportation Needs: Not on file  Physical Activity: Not on file  Stress: Not on file  Social Connections: Not on file     Family History: The patient's family history includes Diabetes in her father, maternal  grandfather, mother, and paternal grandmother; Heart Problems in her brother; Heart attack in her father, maternal grandfather, maternal grandmother, and paternal grandmother; Hyperlipidemia in her father; Hypertension in her mother; Lung cancer in her maternal grandmother; Stroke in her father; Thyroid cancer in her mother.  ROS:   Please see the history of present illness.     All other systems reviewed and are negative.  EKGs/Labs/Other Studies Reviewed:    The following studies were reviewed today: Cardiac event monitor 2014 showed sinus rhythm Echocardiogram 02/27/2012  - Left ventricle: The cavity size was normal. Systolic    function was normal. The estimated ejection fraction was    in the range of 55% to 60%. Wall motion was normal; there    were no regional wall motion abnormalities. Left    ventricular diastolic function parameters were normal.  - Atrial septum: No defect or patent foramen ovale was    identified.  EKG:  EKG is ordered today.  The ekg ordered today demonstrates normal sinus rhythm, normal tracing, QTC 450 ms  Recent Labs: No results found for requested labs within last 8760 hours.  Recent Lipid Panel    Component Value Date/Time   CHOL (H) 02/26/2007 0420    220        ATP III CLASSIFICATION:  <200     mg/dL   Desirable  200-239  mg/dL   Borderline High  >=240    mg/dL   High   TRIG 79 02/26/2007 0420   HDL 55 02/26/2007 0420   CHOLHDL 4.0 02/26/2007 0420   VLDL 16 02/26/2007 0420   LDLCALC (H) 02/26/2007 0420    149        Total Cholesterol/HDL:CHD Risk Coronary Heart Disease Risk Table                     Men   Women  1/2 Average Risk   3.4   3.3   07/23/2019 HDL 49, LDL 69, cholesterol 131, triglycerides 64  Risk Assessment/Calculations:           Physical Exam:    VS:  BP 130/71    Pulse 91    Ht 5\' 7"  (1.702 m)    Wt 241 lb 3.2 oz (109.4 kg)    SpO2 96%    BMI 37.78 kg/m     Wt Readings from Last 3 Encounters:  02/20/21 241 lb  3.2 oz (109.4 kg)  08/26/17 221 lb (100.2 kg)  08/20/17 220 lb (99.8 kg)     GEN: Severely obese Well nourished, well developed in no acute distress HEENT: Normal NECK: No JVD; No carotid bruits LYMPHATICS: No lymphadenopathy CARDIAC: RRR, no murmurs, rubs, gallops.  No murmurs heard with a Valsalva maneuver. RESPIRATORY:  Clear to auscultation  without rales, wheezing or rhonchi  ABDOMEN: Soft, non-tender, non-distended MUSCULOSKELETAL:  No edema; No deformity  SKIN: Warm and dry NEUROLOGIC:  Alert and oriented x 3 PSYCHIATRIC:  Normal affect   ASSESSMENT:    1. Palpitations   2. Mitral valve prolapse   3. Type 1 diabetes mellitus without complication (HCC)   4. Hypercholesterolemia   5. Acquired hypothyroidism    PLAN:    In order of problems listed above:  Palpitations: We will check a 2-week event monitor.  Repeat an echocardiogram.  Empirical metoprolol.  Since she is at risk for hypoglycemia unawareness, we will start off with this medication dosed only "as needed". DM: Although she has type 1 diabetes mellitus, she is also taking saxagliptin/metformin, suggesting a component of obesity related insulin resistance. Hypercholesterolemia: On rosuvastatin.  She does not have known CAD or PAD.  Target LDL less than 100. MVP: Questionable diagnosis.  Repeat echo. Acquired hypothyroidism: After treatment for thyroid cancer.  Reports she had a normal TSH a couple of months ago.           Medication Adjustments/Labs and Tests Ordered: Current medicines are reviewed at length with the patient today.  Concerns regarding medicines are outlined above.  Orders Placed This Encounter  Procedures   LONG TERM MONITOR (3-14 DAYS)   EKG 12-Lead   ECHOCARDIOGRAM COMPLETE   Meds ordered this encounter  Medications   metoprolol tartrate (LOPRESSOR) 25 MG tablet    Sig: Take 1 tablet (25 mg total) by mouth 3 (three) times daily as needed (For palpitations).    Dispense:  30 tablet     Refill:  0    Patient Instructions  Medication Instructions:  TAKE Metoprolol Tartrate 25 mg as needed (up to three times daily) for palpitations.  *If you need a refill on your cardiac medications before your next appointment, please call your pharmacy*   Lab Work: None ordered If you have labs (blood work) drawn today and your tests are completely normal, you will receive your results only by: [place] (if you have MyChart) OR A paper copy in the mail If you have any lab test that is abnormal or we need to change your treatment, we will call you to review the results.   Testing/Procedures: Your physician has requested that you have an echocardiogram. Echocardiography is a painless test that uses sound waves to create images of your heart. It provides your doctor with information about the size and shape of your heart and how well your hearts chambers and valves are working. You may receive an ultrasound enhancing agent through an IV if needed to better visualize your heart during the echo.This procedure takes approximately one hour. There are no restrictions for this procedure. This will take place at the [address], [address].   ZIO XT- Long Term Monitor Instructions  Your physician has requested you wear a ZIO patch monitor for 14 days.  This is a single patch monitor. [employer] supplies one patch monitor per enrollment. Additional stickers are not available. Please do not apply patch if you will be having a Nuclear Stress Test,  Echocardiogram, Cardiac CT, MRI, or Chest Xray during the period you would be wearing the  monitor. The patch cannot be worn during these tests. You cannot remove and re-apply the  ZIO XT patch monitor.  Your ZIO patch monitor will be mailed 3 day USPS to your address on file. It may take 3-5 days  to receive your monitor  after you have been enrolled.  Once you have received your monitor, please review the enclosed instructions. Your monitor   has already been registered assigning a specific monitor serial # to you.  Billing and Patient Assistance Program Information  We have supplied [employer] with any of your insurance information on file for billing purposes. [employer] offers a sliding scale Patient Assistance Program for patients that do not have  insurance, or whose insurance does not completely cover the cost of the ZIO monitor.  You must apply for the Patient Assistance Program to qualify for this discounted rate.  To apply, please call [employer] at [phone], select option 4, select option 2, ask to apply for  Patient Assistance Program. Theodore Demark will ask your household income, and how many people  are in your household. They will quote your out-of-pocket cost based on that information.  [employer] will also be able to set up a 61-month, interest-free payment plan if needed.  Applying the monitor   Shave hair from upper left chest.  Hold abrader disc by orange tab. Rub abrader in 40 strokes over the upper left chest as  indicated in your monitor instructions.  Clean area with 4 enclosed alcohol pads. Let dry.  Apply patch as indicated in monitor instructions. Patch will be placed under collarbone on left  side of chest with arrow pointing upward.  Rub patch adhesive wings for 2 minutes. Remove white label marked "1". Remove the white  label marked "2". Rub patch adhesive wings for 2 additional minutes.  While looking in a mirror, press and release button in center of patch. A small green light will  flash 3-4 times. This will be your only indicator that the monitor has been turned on.  Do not shower for the first 24 hours. You may shower after the first 24 hours.  Press the button if you feel a symptom. You will hear a small click. Record Date, Time and  Symptom in the Patient Logbook.  When you are ready to remove the patch, follow instructions on the last 2 pages of Patient  Logbook. Stick patch monitor onto the last page  of Patient Logbook.  Place Patient Logbook in the blue and white box. Use locking tab on box and tape box closed  securely. The blue and white box has prepaid postage on it. Please place it in the mailbox as  soon as possible. Your physician should have your test results approximately 7 days after the  monitor has been mailed back to [facility].  Call [place] at [phone] if you have questions regarding  your ZIO XT patch monitor. Call them immediately if you see an orange light blinking on your  monitor.  If your monitor falls off in less than 4 days, contact our Monitor department at [phone].  If your monitor becomes loose or falls off after 4 days call [employer] at [phone] for  suggestions on securing your monitor   Follow-Up: At [facility], you and your health needs are our priority.  As part of our continuing mission to provide you with exceptional heart care, we have created designated Provider Care Teams.  These Care Teams include your primary Cardiologist (physician) and Advanced Practice Providers (APPs -  Physician Assistants and Nurse Practitioners) who all work together to provide you with the care you need, when you need it.  We recommend signing up for the patient portal called "MyChart".  Sign up information is provided on this After Visit Summary.  MyChart is used to connect with patients for Virtual Visits (Telemedicine).  Patients are able to view lab/test results, encounter notes, upcoming appointments, etc.  Non-urgent messages can be sent to your provider as well.   To learn more about what you can do with MyChart, go to [email or web address].    Your next appointment:   12 month(s)  The format for your next appointment:   In Person  Provider:   Sanda Klein, MD    Signed, Sanda Klein, MD  02/23/2021 8:03 PM    [place]

## 2021-02-23 ENCOUNTER — Encounter: Payer: Self-pay | Admitting: Cardiovascular Disease

## 2021-02-27 DIAGNOSIS — R002 Palpitations: Secondary | ICD-10-CM | POA: Diagnosis not present

## 2021-03-13 ENCOUNTER — Other Ambulatory Visit: Payer: Self-pay | Admitting: *Deleted

## 2021-03-13 MED ORDER — METOPROLOL SUCCINATE ER 25 MG PO TB24: 25.0000 mg | ORAL_TABLET | Freq: Every day | ORAL | 3 refills | Status: DC

## 2021-03-20 ENCOUNTER — Ambulatory Visit (HOSPITAL_COMMUNITY): Payer: 59 | Attending: Cardiovascular Disease

## 2021-03-20 ENCOUNTER — Other Ambulatory Visit: Payer: Self-pay

## 2021-03-20 DIAGNOSIS — I341 Nonrheumatic mitral (valve) prolapse: Secondary | ICD-10-CM

## 2021-03-20 LAB — ECHOCARDIOGRAM COMPLETE
Area-P 1/2: 4.44 cm2
S' Lateral: 3.1 cm

## 2021-08-24 ENCOUNTER — Encounter: Payer: Self-pay | Admitting: Cardiovascular Disease

## 2022-02-11 ENCOUNTER — Other Ambulatory Visit: Payer: Self-pay | Admitting: Cardiovascular Disease

## 2022-02-12 MED ORDER — METOPROLOL SUCCINATE ER 25 MG PO TB24: 25.0000 mg | ORAL_TABLET | Freq: Every day | ORAL | 1 refills | Status: DC

## 2022-05-11 ENCOUNTER — Other Ambulatory Visit: Payer: Self-pay | Admitting: Cardiovascular Disease

## 2022-05-21 ENCOUNTER — Ambulatory Visit: Payer: 59 | Attending: Cardiovascular Disease | Admitting: Cardiovascular Disease

## 2022-05-21 ENCOUNTER — Encounter: Payer: Self-pay | Admitting: Cardiovascular Disease

## 2022-05-21 VITALS — BP 124/64 | HR 72 | Ht 67.0 in | Wt 250.4 lb

## 2022-05-21 DIAGNOSIS — I493 Ventricular premature depolarization: Secondary | ICD-10-CM | POA: Diagnosis not present

## 2022-05-21 DIAGNOSIS — E7800 Pure hypercholesterolemia, unspecified: Secondary | ICD-10-CM | POA: Diagnosis not present

## 2022-05-21 DIAGNOSIS — E039 Hypothyroidism, unspecified: Secondary | ICD-10-CM | POA: Diagnosis not present

## 2022-05-21 DIAGNOSIS — E109 Type 1 diabetes mellitus without complications: Secondary | ICD-10-CM

## 2022-05-21 DIAGNOSIS — R002 Palpitations: Secondary | ICD-10-CM

## 2022-05-21 MED ORDER — METOPROLOL SUCCINATE ER 25 MG PO TB24: 25.0000 mg | ORAL_TABLET | Freq: Two times a day (BID) | ORAL | 3 refills | Status: DC

## 2022-05-21 NOTE — Progress Notes (Signed)
Cardiology Office Note:    Date:  05/26/2022   ID:  Ashley [name], DOB 1980/05/04, MRN [id number]  PCP:  Vernie Shanks, MD (Inactive)   [facility] Providers Cardiologist:  Sanda Klein, MD     Referring MD: No ref. provider found   Chief Complaint  Patient presents with   Palpitations    History of Present Illness:    Ashley [name] is a 42 y.o. female with a hx of 1 diabetes mellitus since age 61, currently on insulin pump, history of thyroid cancer status postsurgery and radioactive iodine ablation, currently on thyroid hormone supplement, hypercholesterolemia on statin.  Her palpitations are less bothersome now.  They happen maybe once a week and as before most prominent when she lies down in bed at night.  They do not prevent her from going to sleep.  Arrhythmia monitor performed in January 2023 suggesting that her palpitations are due to PVCs.  Her echocardiogram was completely normal, no evidence of LV chamber enlargement/hypertrophy or regional wall motion abnormalities, normal left atrial size, no valvular problems.  The patient specifically denies any chest pain at rest exertion, dyspnea at rest or with exertion, orthopnea, paroxysmal nocturnal dyspnea, syncope, focal neurological deficits, intermittent claudication, lower extremity edema, unexplained weight gain, cough, hemoptysis or wheezing.  Previously she tried to treat these with diltiazem but this caused lethargy and the medicine was stopped.  She is now taking metoprolol succinate 25 mg daily which seems to be working reasonably well.  Glycemic control has been pretty good with a hemoglobin A1c of 7.2%, although not quite at target.  She will have a lipid profile checked when she sees Dr. Chalmers Cater in a few weeks.  Her most recent TSH performed last December was completely normal.     Past Medical History:  Diagnosis Date   Diabetes mellitus without complication ([place])    Thyroid disease     Past Surgical  History:  Procedure Laterality Date   CESAREAN SECTION     COMBINED HYSTEROSCOPY DIAGNOSTIC / D&C     THYROIDECTOMY     TONSILLECTOMY      Current Medications: Current Meds  Medication Sig   calcitRIOL (ROCALTROL) 0.5 MCG capsule Take 0.5 mcg by mouth 2 (two) times daily.   Calcium Carbonate-Vitamin D (CALCIUM-D) 600-400 MG-UNIT TABS Take 1 tablet by mouth 3 (three) times daily.   citalopram (CELEXA) 40 MG tablet citalopram 40 mg tablet  TAKE 1 TABLET BY MOUTH ONCE DAILY   insulin lispro (HUMALOG) 100 UNIT/ML injection Pt is on a pump   levothyroxine (SYNTHROID, LEVOTHROID) 137 MCG tablet Take 137 mcg by mouth daily. as directed   rosuvastatin (CRESTOR) 5 MG tablet rosuvastatin 5 mg tablet  TAKE 1 TABLET BY MOUTH EVERY DAY   Saxagliptin-Metformin 5-500 MG TB24 Take 500 mg by mouth daily.   [DISCONTINUED] FLUoxetine HCl (PROZAC PO) Take 40 mg by mouth daily.   [DISCONTINUED] metoprolol succinate (TOPROL-XL) 25 MG 24 hr tablet Take 1 tablet (25 mg total) by mouth daily. KEEP OV.     Allergies:   Esomeprazole magnesium, Nitrofurantoin monohyd macro, Penicillins, and Latex   Social History   Socioeconomic History   Marital status: Married    Spouse name: Not on file   Number of children: Not on file   Years of education: Not on file   Highest education level: Not on file  Occupational History   Not on file  Tobacco Use   Smoking status: Former    Types:  Cigarettes    Quit date: 03/04/1998    Years since quitting: 24.2   Smokeless tobacco: Never  Substance and Sexual Activity   Alcohol use: Never   Drug use: Never   Sexual activity: Yes    Birth control/protection: I.U.D.  Other Topics Concern   Not on file  Social History Narrative   Not on file   Social Determinants of Health   Financial Resource Strain: Not on file  Food Insecurity: Not on file  Transportation Needs: Not on file  Physical Activity: Not on file  Stress: Not on file  Social Connections: Not on  file     Family History: The patient's family history includes Diabetes in her father, maternal grandfather, mother, and paternal grandmother; Heart Problems in her brother; Heart attack in her father, maternal grandfather, maternal grandmother, and paternal grandmother; Hyperlipidemia in her father; Hypertension in her mother; Lung cancer in her maternal grandmother; Stroke in her father; Thyroid cancer in her mother.  ROS:   Please see the history of present illness.     All other systems reviewed and are negative.  EKGs/Labs/Other Studies Reviewed:    The following studies were reviewed today: Cardiac event monitor 03/12/2021   The dominant rhythm is normal sinus rhythm with normal circadian variation. There are no significant episodes of bradycardia or pauses. There is no evidence of atrial fibrillation or ventricular tachycardia. There are rare PACs and rare PVCs seen. The patient's symptom triggered recordings correlate fairly well with isolated PVCs.   Mostly a normal event monitor.  The patient's symptoms correlate with rare isolated PVCs.  Echocardiogram 03/20/2021   1. Left ventricular ejection fraction, by estimation, is 60 to 65%. Left  ventricular ejection fraction by 3D volume is 62 %. The left ventricle has  normal function. The left ventricle has no regional wall motion  abnormalities. Left ventricular diastolic   parameters were normal.   2. Right ventricular systolic function is normal. The right ventricular  size is normal. There is normal pulmonary artery systolic pressure.   3. The mitral valve is normal in structure. Trivial mitral valve  regurgitation. No evidence of mitral stenosis.   4. The aortic valve is tricuspid. Aortic valve regurgitation is not  visualized. No aortic stenosis is present.   5. The inferior vena cava is normal in size with greater than 50%  respiratory variability, suggesting right atrial pressure of 3 mmHg.   EKG:  EKG is ordered  today.  This shows normal sinus rhythm, questionable left atrial normality, otherwise completely normal tracing, QTc 444 ms  Recent Labs: No results found for requested labs within last 365 days.  02/06/2022 TSH 2.140 Recent Lipid Panel    Component Value Date/Time   CHOL (H) 02/26/2007 0420    220        ATP III CLASSIFICATION:  <200     mg/dL   Desirable  200-239  mg/dL   Borderline High  >=240    mg/dL   High   TRIG 79 02/26/2007 0420   HDL 55 02/26/2007 0420   CHOLHDL 4.0 02/26/2007 0420   VLDL 16 02/26/2007 0420   LDLCALC (H) 02/26/2007 0420    149        Total Cholesterol/HDL:CHD Risk Coronary Heart Disease Risk Table                     Men   Women  1/2 Average Risk   3.4   3.3   07/23/2019 HDL  49, LDL 69, cholesterol 131, triglycerides 64  Risk Assessment/Calculations:           Physical Exam:    VS:  BP 124/64   Pulse 72   Ht 5\' 7"  (1.702 m)   Wt 250 lb 6.4 oz (113.6 kg)   SpO2 97%   BMI 39.22 kg/m     Wt Readings from Last 3 Encounters:  05/21/22 250 lb 6.4 oz (113.6 kg)  02/20/21 241 lb 3.2 oz (109.4 kg)  08/26/17 221 lb (100.2 kg)     General: Alert, oriented x3, no distress, severely obese Head: no evidence of trauma, PERRL, EOMI, no exophtalmos or lid lag, no myxedema, no xanthelasma; normal ears, nose and oropharynx Neck: normal jugular venous pulsations and no hepatojugular reflux; brisk carotid pulses without delay and no carotid bruits Chest: clear to auscultation, no signs of consolidation by percussion or palpation, normal fremitus, symmetrical and full respiratory excursions Cardiovascular: normal position and quality of the apical impulse, regular rhythm, normal first and second heart sounds, no murmurs, rubs or gallops Abdomen: no tenderness or distention, no masses by palpation, no abnormal pulsatility or arterial bruits, normal bowel sounds, no hepatosplenomegaly Extremities: no clubbing, cyanosis or edema; 2+ radial, ulnar and brachial  pulses bilaterally; 2+ right femoral, posterior tibial and dorsalis pedis pulses; 2+ left femoral, posterior tibial and dorsalis pedis pulses; no subclavian or femoral bruits Neurological: grossly nonfocal Psych: Normal mood and affect   ASSESSMENT:    1. PVCs (premature ventricular contractions)   2. Type 1 diabetes mellitus without complication (HCC)   3. Hypercholesterolemia   4. Acquired hypothyroidism   5. Severe obesity (BMI 35.0-39.9) with comorbidity ([place])     PLAN:    In order of problems listed above:  Palpitations: Due to infrequent isolated PVCs.  Doing well with metoprolol.  Can increase to 25 mg twice daily for better symptom relief. DM: Fair glycemic control with a hemoglobin A1c almost at target at 7.2%.  Although she has type 1 diabetes mellitus, she is also taking saxagliptin/metformin, suggesting a component of obesity related insulin resistance.  We discussed issues related to hypoglycemia unawareness with beta-blockers, but she has a Dexcom glucometer should give her plenty of warning.  Weight loss would be beneficial to improve long-term outcomes. Hypercholesterolemia: Most recent lipid profile showed all lipid parameters in target range on rosuvastatin.  She does not have known CAD or PAD.  Target LDL less than 100.  Will have labs with her endocrinologist in a few weeks. MVP: Not confirmed on the most recent echo. Acquired hypothyroidism: After treatment for thyroid cancer.  Clinically euthyroid.  Normal TSH in December.           Medication Adjustments/Labs and Tests Ordered: Current medicines are reviewed at length with the patient today.  Concerns regarding medicines are outlined above.  Orders Placed This Encounter  Procedures   EKG 12-Lead   Meds ordered this encounter  Medications   metoprolol succinate (TOPROL-XL) 25 MG 24 hr tablet    Sig: Take 1 tablet (25 mg total) by mouth in the morning and at bedtime. KEEP OV.    Dispense:  180 tablet     Refill:  3    ZERO refills remain on this prescription. Your patient is requesting advance approval of refills for this medication to [facility]    Patient Instructions  Medication Instructions:  Increase Metoprolol Succinate to 25mg  twice a day *If you need a refill on your cardiac medications  before your next appointment, please call your pharmacy*  Follow-Up: At [facility], you and your health needs are our priority.  As part of our continuing mission to provide you with exceptional heart care, we have created designated Provider Care Teams.  These Care Teams include your primary Cardiologist (physician) and Advanced Practice Providers (APPs -  Physician Assistants and Nurse Practitioners) who all work together to provide you with the care you need, when you need it.  We recommend signing up for the patient portal called "MyChart".  Sign up information is provided on this After Visit Summary.  MyChart is used to connect with patients for Virtual Visits (Telemedicine).  Patients are able to view lab/test results, encounter notes, upcoming appointments, etc.  Non-urgent messages can be sent to your provider as well.   To learn more about what you can do with MyChart, go to [email or web address].    Your next appointment:   1 year(s)  Provider:   Sanda Klein, MD       Signed, Sanda Klein, MD  05/26/2022 3:10 PM    [facility]

## 2022-05-21 NOTE — Patient Instructions (Signed)
Medication Instructions:  Increase Metoprolol Succinate to 25mg  twice a day *If you need a refill on your cardiac medications before your next appointment, please call your pharmacy*  Follow-Up: At [facility], you and your health needs are our priority.  As part of our continuing mission to provide you with exceptional heart care, we have created designated Provider Care Teams.  These Care Teams include your primary Cardiologist (physician) and Advanced Practice Providers (APPs -  Physician Assistants and Nurse Practitioners) who all work together to provide you with the care you need, when you need it.  We recommend signing up for the patient portal called "MyChart".  Sign up information is provided on this After Visit Summary.  MyChart is used to connect with patients for Virtual Visits (Telemedicine).  Patients are able to view lab/test results, encounter notes, upcoming appointments, etc.  Non-urgent messages can be sent to your provider as well.   To learn more about what you can do with MyChart, go to [email or web address].    Your next appointment:   1 year(s)  Provider:   Sanda Klein, MD

## 2022-05-26 ENCOUNTER — Encounter: Payer: Self-pay | Admitting: Cardiovascular Disease

## 2023-04-05 ENCOUNTER — Other Ambulatory Visit: Payer: Self-pay | Admitting: Cardiovascular Disease

## 2023-05-21 ENCOUNTER — Ambulatory Visit: Payer: 59 | Admitting: Cardiovascular Disease

## 2023-07-14 ENCOUNTER — Ambulatory Visit: Admitting: Cardiovascular Disease

## 2023-07-15 ENCOUNTER — Other Ambulatory Visit: Payer: Self-pay | Admitting: General Surgery

## 2023-07-16 LAB — SURGICAL PATHOLOGY

## 2023-08-26 ENCOUNTER — Other Ambulatory Visit: Payer: Self-pay

## 2023-08-26 MED ORDER — METOPROLOL SUCCINATE ER 25 MG PO TB24: ORAL_TABLET | ORAL | 0 refills | Status: DC

## 2023-08-30 ENCOUNTER — Other Ambulatory Visit: Payer: Self-pay | Admitting: Cardiovascular Disease

## 2023-09-19 ENCOUNTER — Encounter: Payer: Self-pay | Admitting: Cardiovascular Disease

## 2023-09-19 ENCOUNTER — Ambulatory Visit: Attending: Cardiovascular Disease | Admitting: Cardiovascular Disease

## 2023-09-19 VITALS — BP 114/70 | HR 70 | Ht 67.0 in | Wt 248.8 lb

## 2023-09-19 DIAGNOSIS — E7800 Pure hypercholesterolemia, unspecified: Secondary | ICD-10-CM | POA: Diagnosis not present

## 2023-09-19 DIAGNOSIS — E039 Hypothyroidism, unspecified: Secondary | ICD-10-CM

## 2023-09-19 DIAGNOSIS — I493 Ventricular premature depolarization: Secondary | ICD-10-CM | POA: Diagnosis not present

## 2023-09-19 DIAGNOSIS — E109 Type 1 diabetes mellitus without complications: Secondary | ICD-10-CM

## 2023-09-19 NOTE — Progress Notes (Signed)
 Cardiology Office Note:    Date:  09/21/2023   ID:  Ashley [name], DOB 12/19/1980, MRN [id number]  PCP:  Ashley Gwenn SQUIBB, MD (Inactive)   [facility] Providers Cardiologist:  Jerel Balding, MD     Referring MD: No ref. provider found   Chief Complaint  Patient presents with   PVCs    History of Present Illness:    Ashley [name] is a 43 y.o. female with a hx of 1 diabetes mellitus since age 48, currently on insulin  pump, history of thyroid  cancer status postsurgery and radioactive iodine ablation, currently on thyroid  hormone supplement, hypercholesterolemia on statin.  From a cardiovascular point of view she has had a good year.  She has not been troubled by palpitations much.  She denies shortness of breath or chest pain at rest or with activity.  She denies syncope she has not had dizziness with the palpitations when they do occur.  She does not have lower extremity edema, orthopnea, PND, claudication or focal neurological complaints.  Arrhythmia monitor performed in January 2023 suggesting that her palpitations are due to PVCs.  Her echocardiogram was completely normal, no evidence of LV chamber enlargement/hypertrophy or regional wall motion abnormalities, normal left atrial size, no valvular problems.  Glycemic control is worse than a year ago with her hemoglobin A1c increasing to 8.2%.  However her lipid profile is excellent with LDL 74, HDL 52, triglycerides 115.  She has normal renal function.  TSH is suppressed at 0.071.  Past Medical History:  Diagnosis Date   Diabetes mellitus without complication (HCC)    Thyroid  disease     Past Surgical History:  Procedure Laterality Date   CESAREAN SECTION     COMBINED HYSTEROSCOPY DIAGNOSTIC / D&C     THYROIDECTOMY     TONSILLECTOMY      Current Medications: Current Meds  Medication Sig   Blood Glucose Monitoring Suppl (ONETOUCH VERIO REFLECT) w/Device KIT See admin instructions.   calcitRIOL (ROCALTROL) 0.5 MCG  capsule Take 0.5 mcg by mouth 2 (two) times daily.   Calcium Carbonate-Vitamin D (CALCIUM-D) 600-400 MG-UNIT TABS Take 1 tablet by mouth 3 (three) times daily.   citalopram (CELEXA) 40 MG tablet citalopram 40 mg tablet  TAKE 1 TABLET BY MOUTH ONCE DAILY   Glucagon (BAQSIMI ONE PACK) 3 MG/DOSE POWD USE AS DIRECTED ONCE A DAY AS NEEDED   Insulin  Disposable Pump (OMNIPOD 5 [id number] PODS GEN 5) MISC SMARTSIG:SUB-Q Every Other Day   insulin  lispro (HUMALOG) 100 UNIT/ML injection Pt is on a pump   levonorgestrel (MIRENA, 52 MG,) 20 MCG/DAY IUD 1 each by Intrauterine route once.   levothyroxine (SYNTHROID) 150 MCG tablet Take 150 mcg by mouth every morning.   levothyroxine (SYNTHROID) 25 MCG tablet Take 25 mcg by mouth once a week. Mondays   metFORMIN (GLUCOPHAGE-XR) 500 MG 24 hr tablet Take 500 mg by mouth 2 (two) times daily.   metoprolol  succinate (TOPROL -XL) 25 MG 24 hr tablet Take 1 tablet by mouth daily in the morning and 1 tablet by mouth daily at bedtime   ONETOUCH ULTRA TEST test strip by Other route as needed.   rosuvastatin (CRESTOR) 5 MG tablet rosuvastatin 5 mg tablet  TAKE 1 TABLET BY MOUTH EVERY DAY   Saxagliptin-Metformin 5-500 MG TB24 Take 500 mg by mouth daily.     Allergies:   Esomeprazole magnesium, Nitrofurantoin monohyd macro, Penicillins, and Latex   Social History   Socioeconomic History   Marital status: Married  Spouse name: Not on file   Number of children: Not on file   Years of education: Not on file   Highest education level: Not on file  Occupational History   Not on file  Tobacco Use   Smoking status: Former    Current packs/day: 0.00    Types: Cigarettes    Quit date: 03/04/1998    Years since quitting: 25.5   Smokeless tobacco: Never  Substance and Sexual Activity   Alcohol use: Never   Drug use: Never   Sexual activity: Yes    Birth control/protection: I.U.D.  Other Topics Concern   Not on file  Social History Narrative   Not on file   Social  Drivers of Health   Financial Resource Strain: Not on file  Food Insecurity: Not on file  Transportation Needs: Not on file  Physical Activity: Not on file  Stress: Not on file  Social Connections: Not on file     Family History: The patient's family history includes Diabetes in her father, maternal grandfather, mother, and paternal grandmother; Heart Problems in her brother; Heart attack in her father, maternal grandfather, maternal grandmother, and paternal grandmother; Hyperlipidemia in her father; Hypertension in her mother; Lung cancer in her maternal grandmother; Stroke in her father; Thyroid  cancer in her mother.  ROS:   Please see the history of present illness.     All other systems reviewed and are negative.  EKGs/Labs/Other Studies Reviewed:    The following studies were reviewed today: Cardiac event monitor 03/12/2021   The dominant rhythm is normal sinus rhythm with normal circadian variation. There are no significant episodes of bradycardia or pauses. There is no evidence of atrial fibrillation or ventricular tachycardia. There are rare PACs and rare PVCs seen. The patient's symptom triggered recordings correlate fairly well with isolated PVCs.   Mostly a normal event monitor.  The patient's symptoms correlate with rare isolated PVCs.  Echocardiogram 03/20/2021   1. Left ventricular ejection fraction, by estimation, is 60 to 65%. Left  ventricular ejection fraction by 3D volume is 62 %. The left ventricle has  normal function. The left ventricle has no regional wall motion  abnormalities. Left ventricular diastolic   parameters were normal.   2. Right ventricular systolic function is normal. The right ventricular  size is normal. There is normal pulmonary artery systolic pressure.   3. The mitral valve is normal in structure. Trivial mitral valve  regurgitation. No evidence of mitral stenosis.   4. The aortic valve is tricuspid. Aortic valve regurgitation is not   visualized. No aortic stenosis is present.   5. The inferior vena cava is normal in size with greater than 50%  respiratory variability, suggesting right atrial pressure of 3 mmHg.   EKG:  EKG is ordered today.  This shows normal sinus rhythm, questionable left atrial normality, otherwise completely normal tracing, QTc 444 ms  Recent Labs: No results found for requested labs within last 365 days.  02/06/2022 TSH 2.140 Recent Lipid Panel    Component Value Date/Time   CHOL (H) 02/26/2007 0420    220        ATP III CLASSIFICATION:  <200     mg/dL   Desirable  799-760  mg/dL   Borderline High  >=759    mg/dL   High   TRIG 79 87/74/7991 0420   HDL 55 02/26/2007 0420   CHOLHDL 4.0 02/26/2007 0420   VLDL 16 02/26/2007 0420   LDLCALC (H) 02/26/2007 0420  149        Total Cholesterol/HDL:CHD Risk Coronary Heart Disease Risk Table                     Men   Women  1/2 Average Risk   3.4   3.3   07/23/2019 HDL 49, LDL 69, cholesterol 131, triglycerides 64  Risk Assessment/Calculations:           Physical Exam:    VS:  BP (S) 114/70 (BP Location: Left Arm, Patient Position: Sitting, Cuff Size: Large)   Pulse 70   Wt 248 lb 12.8 oz (112.9 kg)   SpO2 96%   BMI 38.97 kg/m     Wt Readings from Last 3 Encounters:  09/19/23 248 lb 12.8 oz (112.9 kg)  05/21/22 250 lb 6.4 oz (113.6 kg)  02/20/21 241 lb 3.2 oz (109.4 kg)      General: Alert, oriented x3, no distress, severely obese Head: no evidence of trauma, PERRL, EOMI, no exophtalmos or lid lag, no myxedema, no xanthelasma; normal ears, nose and oropharynx Neck: normal jugular venous pulsations and no hepatojugular reflux; brisk carotid pulses without delay and no carotid bruits Chest: clear to auscultation, no signs of consolidation by percussion or palpation, normal fremitus, symmetrical and full respiratory excursions Cardiovascular: normal position and quality of the apical impulse, regular rhythm, normal first and  second heart sounds, no murmurs, rubs or gallops Abdomen: no tenderness or distention, no masses by palpation, no abnormal pulsatility or arterial bruits, normal bowel sounds, no hepatosplenomegaly Extremities: no clubbing, cyanosis or edema; 2+ radial, ulnar and brachial pulses bilaterally; 2+ right femoral, posterior tibial and dorsalis pedis pulses; 2+ left femoral, posterior tibial and dorsalis pedis pulses; no subclavian or femoral bruits Neurological: grossly nonfocal Psych: Normal mood and affect    ASSESSMENT:    1. PVCs (premature ventricular contractions)   2. Type 1 diabetes mellitus without complication (HCC)   3. Hypercholesterolemia   4. Acquired hypothyroidism     PLAN:    In order of problems listed above:  Palpitations: Controlled on very low-dose of beta-blocker.  Continue. DM type I: Glycemic control has deteriorated a bit.  This may be partly related to her recent trip to [place] with her family.  Target hemoglobin A1c 6-7%. Hypercholesterolemia: All lipid parameters in target range.  Continue rosuvastatin at the current dose. MVP: Not confirmed on the most recent echo. Acquired hypothyroidism: After treatment for thyroid  cancer.  Clinically euthyroid; mildly suppressed TSH.  Follows with Dr. Tommas.           Medication Adjustments/Labs and Tests Ordered: Current medicines are reviewed at length with the patient today.  Concerns regarding medicines are outlined above.  Orders Placed This Encounter  Procedures   EKG 12-Lead   No orders of the defined types were placed in this encounter.   Patient Instructions  Medication Instructions:  No changes *If you need a refill on your cardiac medications before your next appointment, please call your pharmacy*  Lab Work: None ordered If you have labs (blood work) drawn today and your tests are completely normal, you will receive your results only by: MyChart Message (if you have MyChart) OR A paper copy  in the mail If you have any lab test that is abnormal or we need to change your treatment, we will call you to review the results.  Testing/Procedures: None ordered  Follow-Up: At [facility], you and your health needs are our priority.  As part  of our continuing mission to provide you with exceptional heart care, our providers are all part of one team.  This team includes your primary Cardiologist (physician) and Advanced Practice Providers or APPs (Physician Assistants and Nurse Practitioners) who all work together to provide you with the care you need, when you need it.  Your next appointment:   1 year(s)  Provider:   Jerel Balding, MD    We recommend signing up for the patient portal called MyChart.  Sign up information is provided on this After Visit Summary.  MyChart is used to connect with patients for Virtual Visits (Telemedicine).  Patients are able to view lab/test results, encounter notes, upcoming appointments, etc.  Non-urgent messages can be sent to your provider as well.   To learn more about what you can do with MyChart, go to [email or web address].          Signed, Jerel Balding, MD  09/21/2023 2:17 PM    [facility]

## 2023-09-19 NOTE — Patient Instructions (Signed)

## 2023-09-21 ENCOUNTER — Encounter: Payer: Self-pay | Admitting: Cardiovascular Disease

## 2023-12-29 ENCOUNTER — Other Ambulatory Visit: Payer: Self-pay | Admitting: Cardiovascular Disease
